# Patient Record
Sex: Male | Born: 1975 | Race: White | Hispanic: No | Marital: Married | Smoking: Never smoker
Health system: Southern US, Community
[De-identification: ages and names within clinical notes are randomized; demographics above are authoritative.]

## PROBLEM LIST (undated history)

## (undated) DIAGNOSIS — F419 Anxiety disorder, unspecified: Secondary | ICD-10-CM

## (undated) DIAGNOSIS — K5190 Ulcerative colitis, unspecified, without complications: Secondary | ICD-10-CM

## (undated) DIAGNOSIS — K529 Noninfective gastroenteritis and colitis, unspecified: Secondary | ICD-10-CM

## (undated) DIAGNOSIS — T7840XA Allergy, unspecified, initial encounter: Secondary | ICD-10-CM

## (undated) HISTORY — DX: Allergy, unspecified, initial encounter: T78.40XA

## (undated) HISTORY — DX: Anxiety disorder, unspecified: F41.9

---

## 2011-10-23 ENCOUNTER — Other Ambulatory Visit: Payer: Self-pay | Admitting: Physician Assistant

## 2013-04-28 ENCOUNTER — Ambulatory Visit (INDEPENDENT_AMBULATORY_CARE_PROVIDER_SITE_OTHER): Payer: BC Managed Care – PPO | Admitting: Family Medicine

## 2013-04-28 ENCOUNTER — Ambulatory Visit: Payer: BC Managed Care – PPO

## 2013-04-28 VITALS — BP 126/78 | HR 82 | Temp 97.6°F | Resp 18 | Ht 71.0 in | Wt 201.0 lb

## 2013-04-28 DIAGNOSIS — M542 Cervicalgia: Secondary | ICD-10-CM

## 2013-04-28 DIAGNOSIS — T148XXA Other injury of unspecified body region, initial encounter: Secondary | ICD-10-CM

## 2013-04-28 MED ORDER — CYCLOBENZAPRINE HCL 5 MG PO TABS: 5.0000 mg | ORAL_TABLET | Freq: Every evening | ORAL | Status: DC | PRN

## 2013-04-28 MED ORDER — MELOXICAM 7.5 MG PO TABS: 7.5000 mg | ORAL_TABLET | Freq: Two times a day (BID) | ORAL | Status: DC | PRN

## 2013-04-28 NOTE — Patient Instructions (Signed)

## 2013-04-28 NOTE — Progress Notes (Signed)
Chief Complaint:  Chief Complaint  Patient presents with  . kink in neck    x2 weeks     HPI: Charles Hardy is a 37 y.o. male who is here for neck pain. Back of neck left side x 2 weeks. Started off sore than got better. Has been getting worse over last few days. He does outdoor work. Neck stiffness and has a sharp nerve type random pain sometimes when he turns his neck side to side. Heating pads, rest, stretching, Icy Hot, and Ibuprofen aleviated some of the aching but the pain returns depending on the position of his neck. 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 No prior injury. Woke up with a stiff neck and that day , he denies any new beds or pillows. No prior history of neck pain.   Past Medical History  Diagnosis Date  . Allergy    History reviewed. No pertinent past surgical history. History   Social History  . Marital Status: Married    Spouse Name: N/A    Number of Children: N/A  . Years of Education: N/A   Social History Main Topics  . Smoking status: Never Smoker   . Smokeless tobacco: None  . Alcohol Use: None  . Drug Use: None  . Sexual Activity: None   Other Topics Concern  . None   Social History Narrative  . None   Family History  Problem Relation Age of Onset  . Diabetes Mother   . Hypertension Mother   . Diabetes Maternal Grandfather   . Hyperlipidemia Maternal Grandfather   . Hypertension Maternal Grandfather    Allergies  Allergen Reactions  . Bee Venom Anaphylaxis   Prior to Admission medications   Medication Sig Start Date End Date Taking? Authorizing Provider  EPIPEN 2-PAK 0.3 MG/0.3ML DEVI USE AS DIRECTED 10/23/11  Yes Chelle S Jeffery, PA-C     ROS: The patient denies fevers, chills,  night sweats, unintentional weight loss, chest pain, palpitations, wheezing, dyspnea on exertion, nausea, vomiting, abdominal pain, dysuria, hematuria, melena, numbness, weakness, or tingling.   All other systems have been reviewed and were otherwise negative with the exception of those mentioned in the HPI and as above.    PHYSICAL EXAM: Filed Vitals:   04/28/13 1016  BP: 126/78  Pulse: 82  Temp: 97.6 F (36.4 C)  Resp: 18   Filed Vitals:   04/28/13 1016  Height: 5' 11"  (1.803 m)  Weight: 201 lb (91.173 kg)   Body mass index is 28.05 kg/(m^2).  General: Alert, no acute distress HEENT:  Normocephalic, atraumatic, oropharynx patent. EOMI, PERRLA Cardiovascular:  Regular rate and rhythm, no rubs murmurs or gallops.  No Carotid bruits, radial pulse intact. No pedal edema.  Respiratory: Clear to auscultation bilaterally.  No wheezes, rales, or rhonchi.  No cyanosis, no use of accessory musculature GI: No organomegaly, abdomen is soft and non-tender, positive bowel sounds.  No masses. Skin: No rashes. Neurologic: Facial musculature symmetric. Psychiatric: Patient is appropriate throughout our interaction. Lymphatic: No cervical lymphadenopathy Musculoskeletal: Gait intact. Decrease ROM to left, flexion 5/5 strength  Sensation intact Shoulder exam nl T spine nl spuriling neg   LABS: No results found for this or  any previous visit.   EKG/XRAY:   Primary read interpreted by Dr. Marin Comment at [facility]. Loss of normal cervical curvature No fx/dislocation   ASSESSMENT/PLAN: Encounter Diagnoses  Name Primary?  . Neck pain on left side Yes  . Sprain and strain    Most likely sprain/strain,mild c spine torticollis Trial of mobic and flexeril No tramadol for now, may call for it later. HE doe snot want to take nay additional meds if not needed F/u prn ROM   Gross sideeffects, risk and benefits, and alternatives of medications d/w patient. Patient is aware that all medications have  potential sideeffects and we are unable to predict every sideeffect or drug-drug interaction that may occur.  LE, [place], DO 04/28/2013 11:59 AM

## 2013-05-07 ENCOUNTER — Encounter: Payer: Self-pay | Admitting: Family Medicine

## 2013-09-20 ENCOUNTER — Telehealth: Payer: Self-pay

## 2013-09-20 NOTE — Telephone Encounter (Signed)
Patient is out of EPIPEN 2-PAK 0.3 MG/0.3ML DEVI And would like a refill; and wants it just in case he has an emergency because he is out. Please advise. He says [facility] doesn't have any requests on file to send [place] a request.   Best: [phone]

## 2013-09-22 MED ORDER — EPINEPHRINE 0.3 MG/0.3ML IJ SOAJ: INTRAMUSCULAR | Status: DC

## 2013-09-22 NOTE — Telephone Encounter (Signed)
Lmom that rx is at pharmacy.

## 2013-09-22 NOTE — Telephone Encounter (Signed)
Rx sent to pharmacy   

## 2014-11-12 ENCOUNTER — Encounter: Payer: Self-pay | Admitting: Family Medicine

## 2014-11-12 ENCOUNTER — Ambulatory Visit (INDEPENDENT_AMBULATORY_CARE_PROVIDER_SITE_OTHER): Payer: BLUE CROSS/BLUE SHIELD | Admitting: Family Medicine

## 2014-11-12 VITALS — BP 148/93 | HR 65 | Temp 98.8°F | Resp 14 | Ht 71.0 in | Wt 195.4 lb

## 2014-11-12 DIAGNOSIS — Z91030 Bee allergy status: Secondary | ICD-10-CM

## 2014-11-12 DIAGNOSIS — F419 Anxiety disorder, unspecified: Secondary | ICD-10-CM | POA: Diagnosis not present

## 2014-11-12 DIAGNOSIS — Z91038 Other insect allergy status: Secondary | ICD-10-CM

## 2014-11-12 DIAGNOSIS — T63441A Toxic effect of venom of bees, accidental (unintentional), initial encounter: Secondary | ICD-10-CM

## 2014-11-12 DIAGNOSIS — L500 Allergic urticaria: Secondary | ICD-10-CM

## 2014-11-12 MED ORDER — ALPRAZOLAM 0.25 MG PO TABS: 0.2500 mg | ORAL_TABLET | Freq: Three times a day (TID) | ORAL | Status: DC | PRN

## 2014-11-12 MED ORDER — PREDNISONE 10 MG PO TABS: ORAL_TABLET | ORAL | Status: DC

## 2014-11-12 MED ORDER — EPINEPHRINE 0.3 MG/0.3ML IJ SOAJ: INTRAMUSCULAR | Status: DC

## 2014-11-12 MED ORDER — RANITIDINE HCL 150 MG PO CAPS: 150.0000 mg | ORAL_CAPSULE | Freq: Two times a day (BID) | ORAL | Status: DC

## 2014-11-12 MED ORDER — CETIRIZINE HCL 10 MG PO TABS
10.0000 mg | ORAL_TABLET | Freq: Once | ORAL | Status: AC
  Administered 2014-11-12: 10 mg via ORAL

## 2014-11-12 MED ORDER — RANITIDINE HCL 150 MG PO TABS
300.0000 mg | ORAL_TABLET | Freq: Once | ORAL | Status: AC
  Administered 2014-11-12: 300 mg via ORAL

## 2014-11-12 MED ORDER — METHYLPREDNISOLONE SODIUM SUCC 125 MG IJ SOLR
125.0000 mg | Freq: Once | INTRAMUSCULAR | Status: AC
  Administered 2014-11-12: 125 mg via INTRAVENOUS

## 2014-11-12 MED ORDER — DIPHENHYDRAMINE HCL 50 MG PO TABS: 50.0000 mg | ORAL_TABLET | Freq: Every day | ORAL | Status: DC

## 2014-11-12 NOTE — Progress Notes (Addendum)
Subjective:  This chart was scribed for Delman Cheadle, MD by Thea Alken, ED Scribe. This patient was seen in room 6 and the patient's care was started at 10:33 AM.   Patient ID: Charles Hardy, male    DOB: 08-23-1975, 39 y.o.   MRN: [id number] Chief Complaint  Patient presents with  . Insect Bite    Stung by yellow jacket "Allergic"    HPI   HPI Comments: Charles Hardy is a 39 y.o. male who presents to the [facility] complaining of one bee sting to right arm that occurred about 30 minutes ago. Pt is allergic to bee venom but denies taking epipen. He has gone into anaphylactic shock once in the past when stung by multiple bees at once but has had more recent occurrences where he did not need go into anaphylactic shock when stung by a single bee. Pt denies taking medication since being sting including benadryl and zyrtec. He denies swelling around lips.     Past Medical History  Diagnosis Date  . Allergy    No past surgical history on file. Prior to Admission medications   Medication Sig Start Date End Date Taking? Authorizing Provider  cyclobenzaprine (FLEXERIL) 5 MG tablet Take 1 tablet (5 mg total) by mouth at bedtime as needed. 04/28/13   Thao P Le, DO  EPINEPHrine (EPIPEN 2-PAK) 0.3 mg/0.3 mL IJ SOAJ injection Use as directed 09/22/13   Collene Leyden, PA-C  meloxicam (MOBIC) 7.5 MG tablet Take 1 tablet (7.5 mg total) by mouth 2 (two) times daily as needed for pain. Take with food, no other NSAIDs 04/28/13   Thao P Le, DO   Review of Systems  Constitutional: Positive for diaphoresis. Negative for fever, chills, activity change and appetite change.  HENT: Negative for drooling, facial swelling, sore throat, trouble swallowing and voice change.   Respiratory: Negative for apnea, cough, choking, chest tightness, shortness of breath, wheezing and stridor.   Cardiovascular: Negative for chest pain and leg swelling.  Skin: Positive for color change and rash.    Neurological: Negative for dizziness, facial asymmetry and light-headedness.  Hematological: Negative for adenopathy.  Psychiatric/Behavioral: The patient is nervous/anxious.    Objective:   Physical Exam  Constitutional: He is oriented to person, place, and time. He appears well-developed and well-nourished. No distress.  HENT:  Head: Normocephalic and atraumatic. Not macrocephalic.  Mouth/Throat: Oropharynx is clear and moist.  Tongue normal. Throat is clear  Eyes: Conjunctivae and EOM are normal.  Neck: Neck supple.  Cardiovascular: Normal rate, regular rhythm and normal heart sounds.  Exam reveals no gallop and no friction rub.   No murmur heard. Pulmonary/Chest: Effort normal and breath sounds normal. No respiratory distress. He has no wheezes. He has no rales. He exhibits no tenderness.  Good air movement.  Musculoskeletal: Normal range of motion.  Lymphadenopathy:    He has no cervical adenopathy.  Neurological: He is alert and oriented to person, place, and time.  Skin: Skin is warm and dry.  Right upper inner arm with 1cm area of induration. Site of sting with urticarial macule with 6 inch diameter surrounding. Urticarial rash over nose, cheeks and bilateral arms.   Psychiatric: He has a normal mood and affect. His behavior is normal.  Nursing note and vitals reviewed.   Filed Vitals:   11/12/14 1119 11/12/14 1132  BP: 140/86 148/93  Pulse: 77 65  Temp: 97.8 F (36.6 C) 98.8 F (37.1 C)  TempSrc:  Oral Oral  Resp: 16 14  Height: 5' 11"  (1.803 m)   Weight: 195 lb 6.4 oz (88.633 kg)   SpO2: 98% 98%   Assessment & Plan:   1. Bee sting reaction, accidental or unintentional, initial encounter   2. Bee sting allergy   3. Allergic urticaria   4. Anxiety disorder, unspecified anxiety disorder type - about taking meds and doctors.  Refilled epi-pen - fortunately he has only showed angioedema type allergy when he was stung at once by multiple bees. Has not had in sev yrs,  epi-pen expired.  IV placed in office today with IV solumedrol 125 x 1 - significant improvement in urticaria several later so sent home w/ below meds for take sched for 10-14d.  Meds ordered this encounter  Medications  . methylPREDNISolone sodium succinate (SOLU-MEDROL) 125 mg/2 mL injection 125 mg    Sig:   . ranitidine (ZANTAC) tablet 300 mg    Sig:   . cetirizine (ZYRTEC) tablet 10 mg    Sig:   . EPINEPHrine (EPIPEN 2-PAK) 0.3 mg/0.3 mL IJ SOAJ injection    Sig: Use as directed    Dispense:  1 Device    Refill:  0  . predniSONE (DELTASONE) 10 MG tablet    Sig: Take 6 tabs today at 3 pm, take 6 tabs tomorrow morning, then 5 tabs the next morning, 4-3-2-1 so last dose next Fri    Dispense:  27 tablet    Refill:  0  . ranitidine (ZANTAC) 150 MG capsule    Sig: Take 1 capsule (150 mg total) by mouth 2 (two) times daily.    Dispense:  60 capsule    Refill:  11  . ALPRAZolam (XANAX) 0.25 MG tablet    Sig: Take 1 tablet (0.25 mg total) by mouth 3 (three) times daily as needed for anxiety.    Dispense:  30 tablet    Refill:  0  . diphenhydrAMINE (BENADRYL) 50 MG tablet    Sig: Take 1 tablet (50 mg total) by mouth at bedtime.    Dispense:  30 tablet    Refill:  11    I personally performed the services described in this documentation, which was scribed in my presence. The recorded information has been reviewed and considered, and addended by me as needed.  Delman Cheadle, MD MPH

## 2014-11-12 NOTE — Patient Instructions (Signed)
Bee, Wasp, or Hornet Sting Your caregiver has diagnosed you as having an insect sting. An insect sting appears as a red lump in the skin that sometimes has a tiny hole in the center, or it may have a stinger in the center of the wound. The most common stings are from wasps, hornets and bees. Individuals have different reactions to insect stings.  A normal reaction may cause pain, swelling, and redness around the sting site.  A localized allergic reaction may cause swelling and redness that extends beyond the sting site.  A large local reaction may continue to develop over the next 12 to 36 hours.  On occasion, the reactions can be severe (anaphylactic reaction). An anaphylactic reaction may cause wheezing; difficulty breathing; chest pain; fainting; raised, itchy, red patches on the skin; a sick feeling to your stomach (nausea); vomiting; cramping; or diarrhea. If you have had an anaphylactic reaction to an insect sting in the past, you are more likely to have one again. HOME CARE INSTRUCTIONS   With bee stings, a small sac of poison is left in the wound. Brushing across this with something such as a credit card, or anything similar, will help remove this and decrease the amount of the reaction. This same procedure will not help a wasp sting as they do not leave behind a stinger and poison sac.  Apply a cold compress for 10 to 20 minutes every hour for 1 to 2 days, depending on severity, to reduce swelling and itching.  To lessen pain, a paste made of water and baking soda may be rubbed on the bite or sting and left on for 5 minutes.  To relieve itching and swelling, you may use take medication or apply medicated creams or lotions as directed.  Only take over-the-counter or prescription medicines for pain, discomfort, or fever as directed by your caregiver.  Wash the sting site daily with soap and water. Apply antibiotic ointment on the sting site as directed.  If you suffered a severe  reaction:  If you did not require hospitalization, an adult will need to stay with you for 24 hours in case the symptoms return.  You may need to wear a medical bracelet or necklace stating the allergy.  You and your family need to learn when and how to use an anaphylaxis kit or epinephrine injection.  If you have had a severe reaction before, always carry your anaphylaxis kit with you. SEEK MEDICAL CARE IF:   None of the above helps within 2 to 3 days.  The area becomes red, warm, tender, and swollen beyond the area of the bite or sting.  You have an oral temperature above 102 F (38.9 C). SEEK IMMEDIATE MEDICAL CARE IF:  You have symptoms of an allergic reaction which are:  Wheezing.  Difficulty breathing.  Chest pain.  Lightheadedness or fainting.  Itchy, raised, red patches on the skin.  Nausea, vomiting, cramping or diarrhea. ANY OF THESE SYMPTOMS MAY REPRESENT A SERIOUS PROBLEM THAT IS AN EMERGENCY. Do not wait to see if the symptoms will go away. Get medical help right away. Call your local emergency services (911 in [place] DO NOT drive yourself to the hospital. MAKE SURE YOU:   Understand these instructions.  Will watch your condition.  Will get help right away if you are not doing well or get worse. Document Released: 04/22/2005 Document Revised: 07/15/2011 Document Reviewed: 10/07/2009 [employer] Patient Information 2015 [employer]. This information is not intended to replace advice given   to you by your health care provider. Make sure you discuss any questions you have with your health care provider. Hives Hives are itchy, red, swollen areas of the skin. They can vary in size and location on your body. Hives can come and go for hours or several days (acute hives) or for several weeks (chronic hives). Hives do not spread from person to person (noncontagious). They may get worse with scratching, exercise, and emotional stress. CAUSES   Allergic reaction to  food, additives, or drugs.  Infections, including the common cold.  Illness, such as vasculitis, lupus, or thyroid disease.  Exposure to sunlight, heat, or cold.  Exercise.  Stress.  Contact with chemicals. SYMPTOMS   Red or white swollen patches on the skin. The patches may change size, shape, and location quickly and repeatedly.  Itching.  Swelling of the hands, feet, and face. This may occur if hives develop deeper in the skin. DIAGNOSIS  Your caregiver can usually tell what is wrong by performing a physical exam. Skin or blood tests may also be done to determine the cause of your hives. In some cases, the cause cannot be determined. TREATMENT  Mild cases usually get better with medicines such as antihistamines. Severe cases may require an emergency epinephrine injection. If the cause of your hives is known, treatment includes avoiding that trigger.  HOME CARE INSTRUCTIONS   Avoid causes that trigger your hives.  Take antihistamines as directed by your caregiver to reduce the severity of your hives. Non-sedating or low-sedating antihistamines are usually recommended. Do not drive while taking an antihistamine.  Take any other medicines prescribed for itching as directed by your caregiver.  Wear loose-fitting clothing.  Keep all follow-up appointments as directed by your caregiver. SEEK MEDICAL CARE IF:   You have persistent or severe itching that is not relieved with medicine.  You have painful or swollen joints. SEEK IMMEDIATE MEDICAL CARE IF:   You have a fever.  Your tongue or lips are swollen.  You have trouble breathing or swallowing.  You feel tightness in the throat or chest.  You have abdominal pain. These problems may be the first sign of a life-threatening allergic reaction. Call your local emergency services (911 in [place] MAKE SURE YOU:   Understand these instructions.  Will watch your condition.  Will get help right away if you are not doing  well or get worse. Document Released: 04/22/2005 Document Revised: 04/27/2013 Document Reviewed: 07/16/2011 [facility] Patient Information 2015 [place], [place]. This information is not intended to replace advice given to you by your health care provider. Make sure you discuss any questions you have with your health care provider. Anaphylactic Reaction An anaphylactic reaction is a sudden, severe allergic reaction that involves the whole body. It can be life threatening. A hospital stay is often required. People with asthma, eczema, or hay fever are slightly more likely to have an anaphylactic reaction. CAUSES  An anaphylactic reaction may be caused by anything to which you are allergic. After being exposed to the allergic substance, your immune system becomes sensitized to it. When you are exposed to that allergic substance again, an allergic reaction can occur. Common causes of an anaphylactic reaction include:  Medicines.  Foods, especially peanuts, wheat, shellfish, milk, and eggs.  Insect bites or stings.  Blood products.  Chemicals, such as dyes, latex, and contrast material used for imaging tests. SYMPTOMS  When an allergic reaction occurs, the body releases histamine and other substances. These substances cause symptoms such  as tightening of the airway. Symptoms often develop within seconds or minutes of exposure. Symptoms may include:  Skin rash or hives.  Itching.  Chest tightness.  Swelling of the eyes, tongue, or lips.  Trouble breathing or swallowing.  Lightheadedness or fainting.  Anxiety or confusion.  Stomach pains, vomiting, or diarrhea.  Nasal congestion.  A fast or irregular heartbeat (palpitations). DIAGNOSIS  Diagnosis is based on your history of recent exposure to allergic substances, your symptoms, and a physical exam. Your caregiver may also perform blood or urine tests to confirm the diagnosis. TREATMENT  Epinephrine medicine is the main treatment for  an anaphylactic reaction. Other medicines that may be used for treatment include antihistamines, steroids, and albuterol. In severe cases, fluids and medicine to support blood pressure may be given through an intravenous line (IV). Even if you improve after treatment, you need to be observed to make sure your condition does not get worse. This may require a stay in the hospital. [place] a medical alert bracelet or necklace stating your allergy.  You and your family must learn how to use an anaphylaxis kit or give an epinephrine injection to temporarily treat an emergency allergic reaction. Always carry your epinephrine injection or anaphylaxis kit with you. This can be lifesaving if you have a severe reaction.  Do not drive or perform tasks after treatment until the medicines used to treat your reaction have worn off, or until your caregiver says it is okay.  If you have hives or a rash:  Take medicines as directed by your caregiver.  You may use an over-the-counter antihistamine (diphenhydramine) as needed.  Apply cold compresses to the skin or take baths in cool water. Avoid hot baths or showers. SEEK MEDICAL CARE IF:   You develop symptoms of an allergic reaction to a new substance. Symptoms may start right away or minutes later.  You develop a rash, hives, or itching.  You develop new symptoms. SEEK IMMEDIATE MEDICAL CARE IF:   You have swelling of the mouth, difficulty breathing, or wheezing.  You have a tight feeling in your chest or throat.  You develop hives, swelling, or itching all over your body.  You develop severe vomiting or diarrhea.  You feel faint or pass out. This is an emergency. Use your epinephrine injection or anaphylaxis kit as you have been instructed. Call your local emergency services (911 in [place] Even if you improve after the injection, you need to be examined at a hospital emergency department. MAKE SURE YOU:   Understand these  instructions.  Will watch your condition.  Will get help right away if you are not doing well or get worse. Document Released: 04/22/2005 Document Revised: 04/27/2013 Document Reviewed: 07/24/2011 [facility] Patient Information 2015 [place], [place]. This information is not intended to replace advice given to you by your health care provider. Make sure you discuss any questions you have with your health care provider.

## 2015-01-18 ENCOUNTER — Ambulatory Visit (INDEPENDENT_AMBULATORY_CARE_PROVIDER_SITE_OTHER): Payer: BLUE CROSS/BLUE SHIELD | Admitting: Emergency Medicine

## 2015-01-18 ENCOUNTER — Telehealth: Payer: Self-pay | Admitting: *Deleted

## 2015-01-18 VITALS — BP 154/82 | HR 70 | Temp 97.7°F | Resp 16 | Ht 70.0 in | Wt 193.0 lb

## 2015-01-18 DIAGNOSIS — K648 Other hemorrhoids: Secondary | ICD-10-CM

## 2015-01-18 DIAGNOSIS — K644 Residual hemorrhoidal skin tags: Secondary | ICD-10-CM

## 2015-01-18 MED ORDER — HYDROCORTISONE ACETATE 25 MG RE SUPP: 25.0000 mg | Freq: Two times a day (BID) | RECTAL | Status: DC

## 2015-01-18 MED ORDER — STARCH 51 % RE SUPP: 1.0000 | RECTAL | Status: DC | PRN

## 2015-01-18 NOTE — Progress Notes (Signed)
Subjective:  Patient ID: Charles Hardy, male    DOB: 1975/11/13  Age: 39 y.o. MRN: [id number]  CC: Mass   HPI Charles Hardy presents  with a small mass on the left anal for urgent. He says rather tender and not associated with constipation or stools. He has no diarrhea or constipation. He has no recent history of straining at stool. Her sitting for long periods at the bathroom  History Charles Hardy has a past medical history of Allergy.   He has no past surgical history on file.   His  family history includes Diabetes in his maternal grandfather and mother; Hyperlipidemia in his maternal grandfather; Hypertension in his maternal grandfather and mother.  He   reports that he has never smoked. He does not have any smokeless tobacco history on file. His alcohol and drug histories are not on file.  Outpatient Prescriptions Prior to Visit  Medication Sig Dispense Refill  . ALPRAZolam (XANAX) 0.25 MG tablet Take 1 tablet (0.25 mg total) by mouth 3 (three) times daily as needed for anxiety. 30 tablet 0  . EPINEPHrine (EPIPEN 2-PAK) 0.3 mg/0.3 mL IJ SOAJ injection Use as directed 1 Device 0  . cyclobenzaprine (FLEXERIL) 5 MG tablet Take 1 tablet (5 mg total) by mouth at bedtime as needed. (Patient not taking: Reported on 01/18/2015) 30 tablet 0  . diphenhydrAMINE (BENADRYL) 50 MG tablet Take 1 tablet (50 mg total) by mouth at bedtime. (Patient not taking: Reported on 01/18/2015) 30 tablet 11  . meloxicam (MOBIC) 7.5 MG tablet Take 1 tablet (7.5 mg total) by mouth 2 (two) times daily as needed for pain. Take with food, no other NSAIDs (Patient not taking: Reported on 01/18/2015) 30 tablet 0  . predniSONE (DELTASONE) 10 MG tablet Take 6 tabs today at 3 pm, take 6 tabs tomorrow morning, then 5 tabs the next morning, 4-3-2-1 so last dose next Fri (Patient not taking: Reported on 01/18/2015) 27 tablet 0  . ranitidine (ZANTAC) 150 MG capsule Take 1 capsule (150 mg total) by mouth 2 (two) times daily.  (Patient not taking: Reported on 01/18/2015) 60 capsule 11   No facility-administered medications prior to visit.    Social History   Social History  . Marital Status: Married    Spouse Name: N/A  . Number of Children: N/A  . Years of Education: N/A   Social History Main Topics  . Smoking status: Never Smoker   . Smokeless tobacco: None  . Alcohol Use: None  . Drug Use: None  . Sexual Activity: Not Asked   Other Topics Concern  . None   Social History Narrative     Review of Systems  Constitutional: Negative for fever, chills and appetite change.  HENT: Negative for congestion, ear pain, postnasal drip, sinus pressure and sore throat.   Eyes: Negative for pain and redness.  Respiratory: Negative for cough, shortness of breath and wheezing.   Cardiovascular: Negative for leg swelling.  Gastrointestinal: Negative for nausea, vomiting, abdominal pain, diarrhea, constipation and blood in stool.  Endocrine: Negative for polyuria.  Genitourinary: Negative for dysuria, urgency, frequency and flank pain.  Musculoskeletal: Negative for gait problem.  Skin: Negative for rash.  Neurological: Negative for weakness and headaches.  Psychiatric/Behavioral: Negative for confusion and decreased concentration. The patient is not nervous/anxious.     Objective:  BP 154/82 mmHg  Pulse 70  Temp(Src) 97.7 F (36.5 C) (Oral)  Resp 16  Ht 5' 10"  (1.778 m)  Wt 193 lb (87.544  kg)  BMI 27.69 kg/m2  SpO2 98%  Physical Exam  Constitutional: He is oriented to person, place, and time. He appears well-developed and well-nourished.  HENT:  Head: Normocephalic and atraumatic.  Eyes: Conjunctivae are normal. Pupils are equal, round, and reactive to light.  Pulmonary/Chest: Effort normal.  Abdominal: Soft. There is tenderness.  Digital rectal examination was significant only for a small thrombosed external hemorrhoid on the left side. at 9:00  Musculoskeletal: He exhibits no edema.    Neurological: He is alert and oriented to person, place, and time.  Skin: Skin is dry.  Psychiatric: He has a normal mood and affect. His behavior is normal. Thought content normal.      Assessment & Plan:   Charles Hardy was seen today for mass.  Diagnoses and all orders for this visit:  External hemorrhoid  Other orders -     starch (ANUSOL) 51 % suppository; Place 1 suppository rectally as needed for pain.   I am having Mr. Callow start on starch. I am also having him maintain his cyclobenzaprine, meloxicam, EPINEPHrine, predniSONE, ranitidine, ALPRAZolam, and diphenhydrAMINE.  Meds ordered this encounter  Medications  . starch (ANUSOL) 51 % suppository    Sig: Place 1 suppository rectally as needed for pain.    Dispense:  24 suppository    Refill:  0   He was given guidance about good stool hygiene. He was given instructions to follow-up with the hemorrhoid is not improved.  Appropriate red flag conditions were discussed with the patient as well as actions that should be taken.  Patient expressed his understanding.  Follow-up: Return if symptoms worsen or fail to improve.  Roselee Culver, MD

## 2015-01-18 NOTE — Telephone Encounter (Signed)
Pt called stating when he got to the pharmacy his medication was wrong.  He states the pharmacist told him he only had a cream available.  However, I advised pt that it was suppose to be an suppository.   Upon searching under pts chart the Rx was printed and not called in.  After speaking to [facility] pharmacy they stated only Anusol-HC cream was called in which was the wrong medication.  I corrected it over the phone and advised the pt I was calling in the correct Rx.  Pt understood.

## 2015-01-18 NOTE — Patient Instructions (Signed)

## 2015-05-25 ENCOUNTER — Ambulatory Visit (INDEPENDENT_AMBULATORY_CARE_PROVIDER_SITE_OTHER): Payer: BLUE CROSS/BLUE SHIELD | Admitting: Family Medicine

## 2015-05-25 VITALS — BP 140/82 | HR 105 | Temp 97.6°F | Resp 18 | Ht 70.0 in | Wt 197.0 lb

## 2015-05-25 DIAGNOSIS — Z23 Encounter for immunization: Secondary | ICD-10-CM | POA: Diagnosis not present

## 2015-05-25 DIAGNOSIS — R1032 Left lower quadrant pain: Secondary | ICD-10-CM | POA: Diagnosis not present

## 2015-05-25 MED ORDER — METRONIDAZOLE 500 MG PO TABS: 500.0000 mg | ORAL_TABLET | Freq: Three times a day (TID) | ORAL | Status: DC

## 2015-05-25 NOTE — Progress Notes (Signed)
By signing my name below, I, Moises Blood, attest that this documentation has been prepared under the direction and in the presence of Robyn Haber, MD. Electronically Signed: Moises Blood, [place]. 05/25/2015 , 8:44 AM .  Patient was seen in room 8 .   Patient ID: Charles Hardy MRN: [id number], DOB: May 30, 1975, 40 y.o. Date of Encounter: 05/25/2015  Primary Physician: No PCP Per Patient  Chief Complaint:  Chief Complaint  Patient presents with  . Abdominal Pain    lower left abd x2 mths off/on  . Flu Vaccine    HPI:  Charles Hardy is a 40 y.o. male who presents to [facility] complaining of intermittent LLQ abd pain that started in October 2016. He states that pain would sometimes radiate towards the middle and also down to his left testicle. He notes that he had similar symptoms in the past 15-20 years ago. He went to GI and they said it was due to stress. He denies taking medications as he only takes vitamins. He denies having colonoscopy. He has some anxiety going to doctors ("white coat syndrome"). He mentions it sometimes acts up after night beer and snacks while watching sports games.   He also requested a flu shot.   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   Past Medical History  Diagnosis Date  . Allergy   . Anxiety      Home Meds: Prior to Admission medications   Medication Sig Start Date End Date Taking? Authorizing Provider  ALPRAZolam (XANAX) 0.25 MG tablet Take 1 tablet (0.25 mg total) by mouth 3 (three) times daily as needed for anxiety. 11/12/14  Yes Shawnee Knapp, MD  EPINEPHrine (EPIPEN 2-PAK) 0.3 mg/0.3 mL IJ SOAJ injection Use as directed 11/12/14  Yes Shawnee Knapp, MD  Multiple Vitamin (MULTIVITAMIN) capsule Take 1 capsule by mouth daily.   Yes Historical Provider, MD  psyllium (METAMUCIL) 58.6 % powder Take 1 packet by mouth 3 (three) times daily.   Yes Historical Provider, MD  cyclobenzaprine (FLEXERIL) 5 MG tablet Take 1 tablet (5 mg total)  by mouth at bedtime as needed. Patient not taking: Reported on 01/18/2015 04/28/13   Thao P Le, DO  diphenhydrAMINE (BENADRYL) 50 MG tablet Take 1 tablet (50 mg total) by mouth at bedtime. Patient not taking: Reported on 01/18/2015 11/12/14   Shawnee Knapp, MD  hydrocortisone (ANUSOL-HC) 25 MG suppository Place 1 suppository (25 mg total) rectally 2 (two) times daily. Patient not taking: Reported on 05/25/2015 01/18/15   Roselee Culver, MD  meloxicam (MOBIC) 7.5 MG tablet Take 1 tablet (7.5 mg total) by mouth 2 (two) times daily as needed for pain. Take with food, no other NSAIDs Patient not taking: Reported on 01/18/2015 04/28/13   Thao P Le, DO  predniSONE (DELTASONE) 10 MG tablet Take 6 tabs today at 3 pm, take 6 tabs tomorrow morning, then 5 tabs the next morning, 4-3-2-1 so last dose next Fri Patient not taking: Reported on 01/18/2015 11/12/14   Shawnee Knapp, MD  ranitidine (ZANTAC) 150 MG capsule Take 1 capsule (150 mg total) by mouth 2 (two) times daily. Patient not taking: Reported on 01/18/2015 11/12/14   Shawnee Knapp, MD    Allergies:  Allergies  Allergen Reactions  . Bee Venom Anaphylaxis    Social History   Social History  . Marital Status: Married    Spouse Name: N/A  . Number of Children: N/A  . Years of Education: N/A  Occupational History  . Not on file.   Social History Main Topics  . Smoking status: Never Smoker   . Smokeless tobacco: Not on file  . Alcohol Use: Not on file  . Drug Use: Not on file  . Sexual Activity: Not on file   Other Topics Concern  . Not on file   Social History Narrative     Review of Systems: Constitutional: negative for fever, chills, night sweats, weight changes, or fatigue  HEENT: negative for vision changes, hearing loss, congestion, rhinorrhea, ST, epistaxis, or sinus pressure Cardiovascular: negative for chest pain or palpitations Respiratory: negative for hemoptysis, wheezing, shortness of breath, or cough Abdominal: negative for  nausea, vomiting, diarrhea, or constipation; positive for abd pain Dermatological: negative for rash Neurologic: negative for headache, dizziness, or syncope All other systems reviewed and are otherwise negative with the exception to those above and in the HPI.  Physical Exam: Blood pressure 140/82, pulse 105, temperature 97.6 F (36.4 C), temperature source Oral, resp. rate 18, height 5' 10"  (1.778 m), weight 197 lb (89.359 kg), SpO2 99 %., Body mass index is 28.27 kg/(m^2). General: Well developed, well nourished, in no acute distress. Head: Normocephalic, atraumatic, eyes without discharge, sclera non-icteric, nares are without discharge. Bilateral auditory canals clear, TM's are without perforation, pearly grey and translucent with reflective cone of light bilaterally. Oral cavity moist, posterior pharynx without exudate, erythema, peritonsillar abscess, or post nasal drip. Facial rosacea Neck: Supple. No thyromegaly. Full ROM. No lymphadenopathy. Lungs: Clear bilaterally to auscultation without wheezes, rales, or rhonchi. Breathing is unlabored. Heart: RRR with S1 S2. No murmurs, rubs, or gallops appreciated. Abdomen: Soft, non-tender, non-distended with normoactive bowel sounds. No hepatomegaly. No rebound/guarding. No obvious abdominal masses. Mildly tender over LLQ with mild bloating Msk:  Strength and tone normal for age. Extremities/Skin: Warm and dry. No clubbing or cyanosis. No edema. No rashes or suspicious lesions. Neuro: Alert and oriented X 3. Moves all extremities spontaneously. Gait is normal. CNII-XII grossly in tact. Psych:  Responds to questions appropriately with a normal affect.    ASSESSMENT AND PLAN:  40 y.o. year old male with  This chart was scribed in my presence and reviewed by me personally.    ICD-9-CM ICD-10-CM   1. Abdominal pain, left lower quadrant 789.04 R10.32 metroNIDAZOLE (FLAGYL) 500 MG tablet     Ambulatory referral to Gastroenterology  2. Need for  prophylactic vaccination and inoculation against influenza V04.81 Z23 Flu Vaccine QUAD 36+ mos IM      Signed, Robyn Haber, MD 05/25/2015 8:44 AM

## 2015-05-25 NOTE — Patient Instructions (Signed)
Your symptoms in the left lower quadrant suggest that you may have either ulcerative colitis or diverticulitis. There is a possibility that you have irritable bowel syndrome, but I think this is less likely.  Because of the ongoing nature of the symptoms, I think it's important to discuss with a gastroenterologist and consider colonoscopy. At this point in putting on Flagyl because it would cover either a colitis or diverticulitis.

## 2015-05-31 ENCOUNTER — Encounter: Payer: Self-pay | Admitting: Gastroenterology

## 2015-07-20 ENCOUNTER — Ambulatory Visit: Payer: Self-pay | Admitting: Gastroenterology

## 2015-08-31 ENCOUNTER — Telehealth: Payer: Self-pay

## 2015-08-31 NOTE — Telephone Encounter (Signed)
Received 20 pages from [facility] - Colonoscopy and EGD reports

## 2016-01-26 ENCOUNTER — Other Ambulatory Visit: Payer: Self-pay | Admitting: Family Medicine

## 2017-12-10 DIAGNOSIS — K580 Irritable bowel syndrome with diarrhea: Secondary | ICD-10-CM | POA: Diagnosis not present

## 2017-12-10 DIAGNOSIS — R030 Elevated blood-pressure reading, without diagnosis of hypertension: Secondary | ICD-10-CM | POA: Diagnosis not present

## 2017-12-10 DIAGNOSIS — R1032 Left lower quadrant pain: Secondary | ICD-10-CM | POA: Diagnosis not present

## 2017-12-10 DIAGNOSIS — K625 Hemorrhage of anus and rectum: Secondary | ICD-10-CM | POA: Diagnosis not present

## 2017-12-23 DIAGNOSIS — K580 Irritable bowel syndrome with diarrhea: Secondary | ICD-10-CM | POA: Diagnosis not present

## 2017-12-23 DIAGNOSIS — K529 Noninfective gastroenteritis and colitis, unspecified: Secondary | ICD-10-CM | POA: Diagnosis not present

## 2017-12-23 DIAGNOSIS — R030 Elevated blood-pressure reading, without diagnosis of hypertension: Secondary | ICD-10-CM | POA: Diagnosis not present

## 2017-12-23 DIAGNOSIS — K625 Hemorrhage of anus and rectum: Secondary | ICD-10-CM | POA: Diagnosis not present

## 2017-12-23 DIAGNOSIS — R1032 Left lower quadrant pain: Secondary | ICD-10-CM | POA: Diagnosis not present

## 2017-12-31 DIAGNOSIS — R197 Diarrhea, unspecified: Secondary | ICD-10-CM | POA: Diagnosis not present

## 2017-12-31 DIAGNOSIS — R1032 Left lower quadrant pain: Secondary | ICD-10-CM | POA: Diagnosis not present

## 2017-12-31 DIAGNOSIS — K529 Noninfective gastroenteritis and colitis, unspecified: Secondary | ICD-10-CM | POA: Diagnosis not present

## 2018-01-05 DIAGNOSIS — R197 Diarrhea, unspecified: Secondary | ICD-10-CM | POA: Diagnosis not present

## 2018-01-15 DIAGNOSIS — Z01818 Encounter for other preprocedural examination: Secondary | ICD-10-CM | POA: Diagnosis not present

## 2018-01-15 DIAGNOSIS — R197 Diarrhea, unspecified: Secondary | ICD-10-CM | POA: Diagnosis not present

## 2018-01-16 DIAGNOSIS — K529 Noninfective gastroenteritis and colitis, unspecified: Secondary | ICD-10-CM | POA: Diagnosis not present

## 2018-01-28 DIAGNOSIS — M9902 Segmental and somatic dysfunction of thoracic region: Secondary | ICD-10-CM | POA: Diagnosis not present

## 2018-01-28 DIAGNOSIS — M9905 Segmental and somatic dysfunction of pelvic region: Secondary | ICD-10-CM | POA: Diagnosis not present

## 2018-01-28 DIAGNOSIS — K5190 Ulcerative colitis, unspecified, without complications: Secondary | ICD-10-CM | POA: Diagnosis not present

## 2018-01-28 DIAGNOSIS — M9903 Segmental and somatic dysfunction of lumbar region: Secondary | ICD-10-CM | POA: Diagnosis not present

## 2018-01-28 DIAGNOSIS — M9901 Segmental and somatic dysfunction of cervical region: Secondary | ICD-10-CM | POA: Diagnosis not present

## 2018-01-29 DIAGNOSIS — K5190 Ulcerative colitis, unspecified, without complications: Secondary | ICD-10-CM | POA: Diagnosis not present

## 2018-01-30 ENCOUNTER — Telehealth: Payer: Self-pay | Admitting: Gastroenterology

## 2018-01-30 DIAGNOSIS — M9903 Segmental and somatic dysfunction of lumbar region: Secondary | ICD-10-CM | POA: Diagnosis not present

## 2018-01-30 DIAGNOSIS — M9905 Segmental and somatic dysfunction of pelvic region: Secondary | ICD-10-CM | POA: Diagnosis not present

## 2018-01-30 DIAGNOSIS — M9902 Segmental and somatic dysfunction of thoracic region: Secondary | ICD-10-CM | POA: Diagnosis not present

## 2018-01-30 DIAGNOSIS — M9901 Segmental and somatic dysfunction of cervical region: Secondary | ICD-10-CM | POA: Diagnosis not present

## 2018-01-30 NOTE — Telephone Encounter (Signed)
ROI faxed to [facility] for records

## 2018-02-10 NOTE — Telephone Encounter (Signed)
Rec'd from [facility] forward 83 pages to [facility]

## 2018-02-19 NOTE — Telephone Encounter (Signed)
Records placed on Dr. Corena Pilgrim desk.  Pt is requesting Dr. Loletha Carrow bc he has heard good things and he would like a 2nd opinion

## 2018-02-23 NOTE — Telephone Encounter (Signed)
I would be glad to see him in second opinion, and will review the records at the visit.  Please arrange a new appointment with me.

## 2018-02-24 ENCOUNTER — Encounter: Payer: Self-pay | Admitting: Gastroenterology

## 2018-03-02 NOTE — Telephone Encounter (Signed)
Pt is scheduled for 03-24-18 with Dr. Loletha Carrow

## 2018-03-24 ENCOUNTER — Ambulatory Visit: Payer: Self-pay | Admitting: Gastroenterology

## 2018-03-25 DIAGNOSIS — K5190 Ulcerative colitis, unspecified, without complications: Secondary | ICD-10-CM | POA: Diagnosis not present

## 2018-03-25 DIAGNOSIS — Z23 Encounter for immunization: Secondary | ICD-10-CM | POA: Diagnosis not present

## 2018-03-25 DIAGNOSIS — I10 Essential (primary) hypertension: Secondary | ICD-10-CM | POA: Diagnosis not present

## 2018-12-29 DIAGNOSIS — K5190 Ulcerative colitis, unspecified, without complications: Secondary | ICD-10-CM | POA: Diagnosis not present

## 2018-12-29 DIAGNOSIS — Z1159 Encounter for screening for other viral diseases: Secondary | ICD-10-CM | POA: Diagnosis not present

## 2019-01-29 DIAGNOSIS — K5190 Ulcerative colitis, unspecified, without complications: Secondary | ICD-10-CM | POA: Diagnosis not present

## 2019-01-29 DIAGNOSIS — Z01818 Encounter for other preprocedural examination: Secondary | ICD-10-CM | POA: Diagnosis not present

## 2019-02-04 DIAGNOSIS — K5190 Ulcerative colitis, unspecified, without complications: Secondary | ICD-10-CM | POA: Diagnosis not present

## 2019-02-10 ENCOUNTER — Encounter (HOSPITAL_COMMUNITY): Payer: Self-pay

## 2019-02-10 ENCOUNTER — Observation Stay (HOSPITAL_COMMUNITY): Payer: BC Managed Care – PPO

## 2019-02-10 ENCOUNTER — Inpatient Hospital Stay (HOSPITAL_COMMUNITY)
Admission: EM | Admit: 2019-02-10 | Discharge: 2019-02-13 | DRG: 385 | Disposition: A | Discharge status: Home / Self Care | Payer: BC Managed Care – PPO | Attending: Internal Medicine | Admitting: Internal Medicine

## 2019-02-10 ENCOUNTER — Other Ambulatory Visit: Payer: Self-pay

## 2019-02-10 DIAGNOSIS — F10939 Alcohol use, unspecified with withdrawal, unspecified: Secondary | ICD-10-CM | POA: Diagnosis present

## 2019-02-10 DIAGNOSIS — K254 Chronic or unspecified gastric ulcer with hemorrhage: Secondary | ICD-10-CM | POA: Diagnosis present

## 2019-02-10 DIAGNOSIS — F1020 Alcohol dependence, uncomplicated: Secondary | ICD-10-CM | POA: Diagnosis not present

## 2019-02-10 DIAGNOSIS — D509 Iron deficiency anemia, unspecified: Secondary | ICD-10-CM

## 2019-02-10 DIAGNOSIS — K5190 Ulcerative colitis, unspecified, without complications: Secondary | ICD-10-CM | POA: Diagnosis present

## 2019-02-10 DIAGNOSIS — E440 Moderate protein-calorie malnutrition: Secondary | ICD-10-CM | POA: Diagnosis not present

## 2019-02-10 DIAGNOSIS — K51018 Ulcerative (chronic) pancolitis with other complication: Secondary | ICD-10-CM | POA: Diagnosis not present

## 2019-02-10 DIAGNOSIS — F10239 Alcohol dependence with withdrawal, unspecified: Secondary | ICD-10-CM | POA: Diagnosis present

## 2019-02-10 DIAGNOSIS — K760 Fatty (change of) liver, not elsewhere classified: Secondary | ICD-10-CM | POA: Diagnosis present

## 2019-02-10 DIAGNOSIS — Z91030 Bee allergy status: Secondary | ICD-10-CM | POA: Diagnosis not present

## 2019-02-10 DIAGNOSIS — E43 Unspecified severe protein-calorie malnutrition: Secondary | ICD-10-CM | POA: Diagnosis not present

## 2019-02-10 DIAGNOSIS — R109 Unspecified abdominal pain: Secondary | ICD-10-CM | POA: Diagnosis not present

## 2019-02-10 DIAGNOSIS — D649 Anemia, unspecified: Secondary | ICD-10-CM | POA: Diagnosis not present

## 2019-02-10 DIAGNOSIS — F10230 Alcohol dependence with withdrawal, uncomplicated: Secondary | ICD-10-CM | POA: Diagnosis not present

## 2019-02-10 DIAGNOSIS — R634 Abnormal weight loss: Secondary | ICD-10-CM

## 2019-02-10 DIAGNOSIS — F10231 Alcohol dependence with withdrawal delirium: Secondary | ICD-10-CM | POA: Diagnosis not present

## 2019-02-10 DIAGNOSIS — K51019 Ulcerative (chronic) pancolitis with unspecified complications: Secondary | ICD-10-CM | POA: Diagnosis not present

## 2019-02-10 DIAGNOSIS — Y908 Blood alcohol level of 240 mg/100 ml or more: Secondary | ICD-10-CM | POA: Diagnosis not present

## 2019-02-10 DIAGNOSIS — Z7952 Long term (current) use of systemic steroids: Secondary | ICD-10-CM

## 2019-02-10 DIAGNOSIS — Z79899 Other long term (current) drug therapy: Secondary | ICD-10-CM

## 2019-02-10 DIAGNOSIS — K51011 Ulcerative (chronic) pancolitis with rectal bleeding: Secondary | ICD-10-CM | POA: Diagnosis not present

## 2019-02-10 DIAGNOSIS — K51911 Ulcerative colitis, unspecified with rectal bleeding: Principal | ICD-10-CM | POA: Diagnosis present

## 2019-02-10 DIAGNOSIS — R531 Weakness: Secondary | ICD-10-CM

## 2019-02-10 DIAGNOSIS — Z20828 Contact with and (suspected) exposure to other viral communicable diseases: Secondary | ICD-10-CM | POA: Diagnosis not present

## 2019-02-10 DIAGNOSIS — K5100 Ulcerative (chronic) pancolitis without complications: Secondary | ICD-10-CM | POA: Diagnosis not present

## 2019-02-10 DIAGNOSIS — F10229 Alcohol dependence with intoxication, unspecified: Secondary | ICD-10-CM | POA: Diagnosis not present

## 2019-02-10 DIAGNOSIS — F419 Anxiety disorder, unspecified: Secondary | ICD-10-CM | POA: Diagnosis present

## 2019-02-10 HISTORY — DX: Ulcerative colitis, unspecified, without complications: K51.90

## 2019-02-10 LAB — CBC
HCT: 29.8 % — ABNORMAL LOW (ref 39.0–52.0)
Hemoglobin: 9.2 g/dL — ABNORMAL LOW (ref 13.0–17.0)
MCH: 27.1 pg (ref 26.0–34.0)
MCHC: 30.9 g/dL (ref 30.0–36.0)
MCV: 87.6 fL (ref 80.0–100.0)
Platelets: 386 10*3/uL (ref 150–400)
RBC: 3.4 MIL/uL — ABNORMAL LOW (ref 4.22–5.81)
RDW: 23 % — ABNORMAL HIGH (ref 11.5–15.5)
WBC: 6.3 10*3/uL (ref 4.0–10.5)
nRBC: 0.6 % — ABNORMAL HIGH (ref 0.0–0.2)

## 2019-02-10 LAB — URINALYSIS, ROUTINE W REFLEX MICROSCOPIC
Bacteria, UA: NONE SEEN
Bilirubin Urine: NEGATIVE
Glucose, UA: NEGATIVE mg/dL
Hgb urine dipstick: NEGATIVE
Ketones, ur: 20 mg/dL — AB
Leukocytes,Ua: NEGATIVE
Nitrite: NEGATIVE
Protein, ur: 30 mg/dL — AB
Specific Gravity, Urine: 1.019 (ref 1.005–1.030)
pH: 5 (ref 5.0–8.0)

## 2019-02-10 LAB — COMPREHENSIVE METABOLIC PANEL
ALT: 75 U/L — ABNORMAL HIGH (ref 0–44)
AST: 149 U/L — ABNORMAL HIGH (ref 15–41)
Albumin: 2.3 g/dL — ABNORMAL LOW (ref 3.5–5.0)
Alkaline Phosphatase: 109 U/L (ref 38–126)
Anion gap: 16 — ABNORMAL HIGH (ref 5–15)
BUN: 6 mg/dL (ref 6–20)
CO2: 21 mmol/L — ABNORMAL LOW (ref 22–32)
Calcium: 7.7 mg/dL — ABNORMAL LOW (ref 8.9–10.3)
Chloride: 103 mmol/L (ref 98–111)
Creatinine, Ser: 0.79 mg/dL (ref 0.61–1.24)
GFR calc Af Amer: >60 mL/min (ref >60)
GFR calc non Af Amer: >60 mL/min (ref >60)
Glucose, Bld: 127 mg/dL — ABNORMAL HIGH (ref 70–99)
Potassium: 4.1 mmol/L (ref 3.5–5.1)
Sodium: 140 mmol/L (ref 135–145)
Total Bilirubin: 0.5 mg/dL (ref 0.3–1.2)
Total Protein: 7 g/dL (ref 6.5–8.1)

## 2019-02-10 LAB — ETHANOL: Alcohol, Ethyl (B): 327 mg/dL (ref <10)

## 2019-02-10 LAB — LIPASE, BLOOD: Lipase: 39 U/L (ref 11–51)

## 2019-02-10 MED ORDER — SODIUM CHLORIDE 0.9 % IV BOLUS
1000.0000 mL | Freq: Once | INTRAVENOUS | Status: AC
  Administered 2019-02-10: 1000 mL via INTRAVENOUS

## 2019-02-10 MED ORDER — THIAMINE HCL 100 MG/ML IJ SOLN
100.0000 mg | Freq: Every day | INTRAMUSCULAR | Status: DC
  Administered 2019-02-10: 100 mg via INTRAVENOUS
  Filled 2019-02-10: qty 2

## 2019-02-10 MED ORDER — LORAZEPAM 2 MG/ML IJ SOLN
0.0000 mg | Freq: Four times a day (QID) | INTRAMUSCULAR | Status: DC
  Administered 2019-02-10 (×2): 1 mg via INTRAVENOUS
  Filled 2019-02-10 (×2): qty 1

## 2019-02-10 MED ORDER — LORAZEPAM 2 MG/ML IJ SOLN: 0.0000 mg | Freq: Two times a day (BID) | INTRAMUSCULAR | Status: DC

## 2019-02-10 MED ORDER — VITAMIN B-1 100 MG PO TABS: 100.0000 mg | ORAL_TABLET | Freq: Every day | ORAL | Status: DC

## 2019-02-10 MED ORDER — SODIUM CHLORIDE 0.9 % IV SOLN
INTRAVENOUS | Status: DC
  Administered 2019-02-11: 02:00:00 via INTRAVENOUS

## 2019-02-10 MED ORDER — LORAZEPAM 1 MG PO TABS: 0.0000 mg | ORAL_TABLET | Freq: Two times a day (BID) | ORAL | Status: DC

## 2019-02-10 MED ORDER — LORAZEPAM 1 MG PO TABS: 0.0000 mg | ORAL_TABLET | Freq: Four times a day (QID) | ORAL | Status: DC

## 2019-02-10 MED ORDER — PANTOPRAZOLE SODIUM 40 MG PO TBEC
80.0000 mg | DELAYED_RELEASE_TABLET | Freq: Every day | ORAL | Status: DC
  Administered 2019-02-10: 80 mg via ORAL
  Filled 2019-02-10: qty 2

## 2019-02-10 MED ORDER — SODIUM CHLORIDE 0.9% FLUSH: 3.0000 mL | Freq: Once | INTRAVENOUS | Status: DC

## 2019-02-10 NOTE — ED Triage Notes (Addendum)
Patient has hx of ulcerative colitis and had ENDO 2 weeks ago and ulcers were found throughout digestive track. Patient here with ongoing malnutrition and reports daily ETOH use to cope with the pain. Patient alert and oriented. Has lost 30 lbs the past couple of months.  Diarrhea multiple episodes daily and increased sleeping per spouse

## 2019-02-10 NOTE — ED Provider Notes (Signed)
[facility] EMERGENCY DEPARTMENT Provider Note   CSN: [id number] Arrival date & time: 02/10/19  1844     History   Chief Complaint No chief complaint on file.   HPI Charles Hardy is a 43 y.o. male with a past medical history of ulcerative colitis.  Patient complains that he has had significant difficulty controlling his UC and difficulty communicating and following up with his GI specialist at [facility] secondary to the coronavirus outbreak.  Patient is on Humira and states that it has not been controlling his symptoms well.  Patient states that he has a history of alcohol dependency and abuse and has been drinking about half a gallon a day since the onset of the pandemic "to cope with my symptoms."  He admits to an alcohol dependence and states that he gets very shaky when he does not drink.  He saw his GI specialist about a month ago and states that it took about a month to get his labs back.  He had a recent colonoscopy about a week or 2 ago that showed severe ulcerative colitis throughout the entire colon.  He complains of loss of appetite, he feels that he is weak and malnourished.  He is using the bathroom "constantly."  Having diarrhea with bloody and mucousy discharge.  He denies any severe abdominal pain.  Last alcohol use was several hours prior to arrival.  He has a 30 pound weight loss over the past few months.  Patient was recently placed on 40 mg daily of prednisone and is continuing to take his Humira.     HPI  Past Medical History:  Diagnosis Date   Allergy    Anxiety    Ulcerative colitis ([place])     There are no active problems to display for this patient.   History reviewed. No pertinent surgical history.      Home Medications    Prior to Admission medications   Medication Sig Start Date End Date Taking? Authorizing Provider  ALPRAZolam (XANAX) 0.25 MG tablet Take 1 tablet (0.25 mg total) by mouth 3 (three) times daily as needed for  anxiety. 11/12/14   Shawnee Knapp, MD  diphenhydrAMINE (BENADRYL) 50 MG tablet Take 1 tablet (50 mg total) by mouth at bedtime. Patient not taking: Reported on 01/18/2015 11/12/14   Shawnee Knapp, MD  EPIPEN 2-PAK 0.3 MG/0.3ML SOAJ injection USE AS DIRECTED 01/27/16   Shawnee Knapp, MD  metroNIDAZOLE (FLAGYL) 500 MG tablet Take 1 tablet (500 mg total) by mouth 3 (three) times daily. 05/25/15   Robyn Haber, MD  Multiple Vitamin (MULTIVITAMIN) capsule Take 1 capsule by mouth daily.    [provider]  ranitidine (ZANTAC) 150 MG capsule Take 1 capsule (150 mg total) by mouth 2 (two) times daily. Patient not taking: Reported on 01/18/2015 11/12/14   Shawnee Knapp, MD    Family History Family History  Problem Relation Age of Onset   Diabetes Mother    Hypertension Mother    Diabetes Maternal Grandfather    Hyperlipidemia Maternal Grandfather    Hypertension Maternal Grandfather     Social History Social History   Tobacco Use   Smoking status: Never Smoker  Substance Use Topics   Alcohol use: Not on file   Drug use: Not on file     Allergies   Bee venom   Review of Systems Review of Systems  Ten systems reviewed and are negative for acute change, except as noted in the  HPI.   Physical Exam Updated Vital Signs BP (!) 145/105    Pulse (!) 118    Temp 98.4 F (36.9 C) (Oral)    Resp 20    SpO2 97%   Physical Exam Vitals signs and nursing note reviewed.  Constitutional:      General: He is not in acute distress.    Appearance: He is well-developed and normal weight. He is not diaphoretic.  HENT:     Head: Normocephalic and atraumatic.  Eyes:     General: No scleral icterus.    Conjunctiva/sclera: Conjunctivae normal.  Neck:     Musculoskeletal: Normal range of motion and neck supple.  Cardiovascular:     Rate and Rhythm: Regular rhythm. Tachycardia present.     Heart sounds: Normal heart sounds.  Pulmonary:     Effort: Pulmonary effort is normal. No respiratory  distress.     Breath sounds: Normal breath sounds.  Abdominal:     Palpations: Abdomen is soft.     Tenderness: There is no abdominal tenderness. There is no guarding or rebound.  Skin:    General: Skin is warm and dry.  Neurological:     Mental Status: He is alert.  Psychiatric:        Behavior: Behavior normal.      ED Treatments / Results  Labs (all labs ordered are listed, but only abnormal results are displayed) Labs Reviewed  COMPREHENSIVE METABOLIC PANEL - Abnormal; Notable for the following components:      Result Value   CO2 21 (*)    Glucose, Bld 127 (*)    Calcium 7.7 (*)    Albumin 2.3 (*)    AST 149 (*)    ALT 75 (*)    Anion gap 16 (*)    All other components within normal limits  CBC - Abnormal; Notable for the following components:   RBC 3.40 (*)    Hemoglobin 9.2 (*)    HCT 29.8 (*)    RDW 23.0 (*)    nRBC 0.6 (*)    All other components within normal limits  URINALYSIS, ROUTINE W REFLEX MICROSCOPIC - Abnormal; Notable for the following components:   APPearance HAZY (*)    Ketones, ur 20 (*)    Protein, ur 30 (*)    All other components within normal limits  LIPASE, BLOOD  ETHANOL    EKG EKG Interpretation  Date/Time:  Wednesday February 10 2019 19:17:21 EDT Ventricular Rate:  122 PR Interval:    QRS Duration: 79 QT Interval:  293 QTC Calculation: 418 R Axis:   65 Text Interpretation:  Sinus tachycardia Abnormal R-wave progression, early transition No previous ECGs available Confirmed by Theotis Burrow [phone]) on 02/10/2019 8:40:47 PM   Radiology No results found.  Procedures .Critical Care Performed by: Margarita Mail, PA-C Authorized by: Margarita Mail, PA-C   Critical care provider statement:    Critical care time (minutes):  35   Critical care time was exclusive of:  Separately billable procedures and treating other patients   Critical care was necessary to treat or prevent imminent or life-threatening deterioration of the  following conditions: Alcohol withdrawal.   Critical care was time spent personally by me on the following activities:  Discussions with consultants, evaluation of patient's response to treatment, examination of patient, ordering and performing treatments and interventions, ordering and review of laboratory studies, ordering and review of radiographic studies, pulse oximetry, re-evaluation of patient's condition, obtaining history from patient or surrogate and review  of old charts   (including critical care time)  Medications Ordered in ED Medications  sodium chloride flush (NS) 0.9 % injection 3 mL (has no administration in time range)  LORazepam (ATIVAN) injection 0-4 mg (1 mg Intravenous Given 02/10/19 2144)    Or  LORazepam (ATIVAN) tablet 0-4 mg ( Oral See Alternative 02/10/19 2144)  LORazepam (ATIVAN) injection 0-4 mg (has no administration in time range)    Or  LORazepam (ATIVAN) tablet 0-4 mg (has no administration in time range)  thiamine (VITAMIN B-1) tablet 100 mg ( Oral See Alternative 02/10/19 2046)    Or  thiamine (B-1) injection 100 mg (100 mg Intravenous Given 02/10/19 2046)  pantoprazole (PROTONIX) EC tablet 80 mg (80 mg Oral Given 02/10/19 2142)  0.9 %  sodium chloride infusion (has no administration in time range)  sodium chloride 0.9 % bolus 1,000 mL (0 mLs Intravenous Stopped 02/10/19 2144)     Initial Impression / Assessment and Plan / ED Course  I have reviewed the triage vital signs and the nursing notes.  Pertinent labs & imaging results that were available during my care of the patient were reviewed by me and considered in my medical decision making (see chart for details).  Clinical Course as of Feb 10 2108  Wed Feb 10, 2019  2044 AST(!): 149 [AH]  2044 ALT(!): 75 [AH]    Clinical Course User Index [AH] Margarita Mail, PA-C      DT:[id number] VS:  Vitals:   02/10/19 2031 02/10/19 2100 02/10/19 2130 02/10/19 2140  BP: (!) 145/105 (!) 127/102 (!) 133/104  (!) 133/104  Pulse: (!) 118 (!) 111 (!) 104 (!) 118  Resp:  (!) 27 (!) 27   Temp:      TempSrc:      SpO2:  98% 96%    KD:XIPJASN is gathered by patient and emr. DDX:.The differential diagnosis of weakness includes but is not limited to neurologic causes (GBS, myasthenia gravis, CVA, MS, ALS, transverse myelitis, spinal cord injury, CVA, botulism, ) and other causes: ACS, Arrhythmia, syncope, orthostatic hypotension, sepsis, hypoglycemia, electrolyte disturbance, hypothyroidism, respiratory failure, symptomatic anemia, dehydration, heat injury, polypharmacy, malignancy. Labs: I reviewed the labs which show urine shows ketones, protein and no evidence of infection.  Alcohol level 327 with symptoms of withdrawal.  Patient's CMP shows elevated blood glucose although he is currently taking prednisone.  Bicarb is low.  Low calcium, low albumin, AST and ALT elevated and a 2-1 parent are consistent with alcohol abuse.  CBC shows a normocytic anemia however patient's lab work done at [facility] shows evidence of iron deficiency anemia.  This may be bleeding from his UC. Imaging acute abdominal film pending EKG: EKG shows sinus tachycardia MDM: Patient here with weakness, fatigue, not eating, 30 pound weight loss over the past several months with ulcerative colitis flare.  He is also severely alcohol dependent and has tachycardia and hypertension consistent with withdrawal. Patient disposition: Admit Patient condition: Serious. The patient appears reasonably stabilized for admission considering the current resources, flow, and capabilities available in the ED at this time, and I doubt any other [facility] requiring further screening and/or treatment in the ED prior to admission.   Final Clinical Impressions(s) / ED Diagnoses   Final diagnoses:  None    ED Discharge Orders    None       Margarita Mail, PA-C 02/10/19 2339    Little, Wenda Overland, MD 02/11/19 [phone]

## 2019-02-11 ENCOUNTER — Observation Stay (HOSPITAL_COMMUNITY): Payer: BC Managed Care – PPO

## 2019-02-11 DIAGNOSIS — K51011 Ulcerative (chronic) pancolitis with rectal bleeding: Secondary | ICD-10-CM

## 2019-02-11 DIAGNOSIS — K760 Fatty (change of) liver, not elsewhere classified: Secondary | ICD-10-CM | POA: Diagnosis present

## 2019-02-11 DIAGNOSIS — D649 Anemia, unspecified: Secondary | ICD-10-CM

## 2019-02-11 DIAGNOSIS — K51911 Ulcerative colitis, unspecified with rectal bleeding: Secondary | ICD-10-CM | POA: Diagnosis present

## 2019-02-11 DIAGNOSIS — E440 Moderate protein-calorie malnutrition: Secondary | ICD-10-CM | POA: Diagnosis not present

## 2019-02-11 DIAGNOSIS — Z79899 Other long term (current) drug therapy: Secondary | ICD-10-CM | POA: Diagnosis not present

## 2019-02-11 DIAGNOSIS — D509 Iron deficiency anemia, unspecified: Secondary | ICD-10-CM | POA: Diagnosis present

## 2019-02-11 DIAGNOSIS — F10239 Alcohol dependence with withdrawal, unspecified: Secondary | ICD-10-CM

## 2019-02-11 DIAGNOSIS — Y908 Blood alcohol level of 240 mg/100 ml or more: Secondary | ICD-10-CM | POA: Diagnosis present

## 2019-02-11 DIAGNOSIS — F10230 Alcohol dependence with withdrawal, uncomplicated: Secondary | ICD-10-CM | POA: Diagnosis not present

## 2019-02-11 DIAGNOSIS — K51018 Ulcerative (chronic) pancolitis with other complication: Secondary | ICD-10-CM | POA: Diagnosis not present

## 2019-02-11 DIAGNOSIS — E43 Unspecified severe protein-calorie malnutrition: Secondary | ICD-10-CM | POA: Diagnosis present

## 2019-02-11 DIAGNOSIS — Z91030 Bee allergy status: Secondary | ICD-10-CM | POA: Diagnosis not present

## 2019-02-11 DIAGNOSIS — F10231 Alcohol dependence with withdrawal delirium: Secondary | ICD-10-CM | POA: Diagnosis not present

## 2019-02-11 DIAGNOSIS — F10229 Alcohol dependence with intoxication, unspecified: Secondary | ICD-10-CM | POA: Diagnosis present

## 2019-02-11 DIAGNOSIS — Z20828 Contact with and (suspected) exposure to other viral communicable diseases: Secondary | ICD-10-CM | POA: Diagnosis present

## 2019-02-11 DIAGNOSIS — Z7952 Long term (current) use of systemic steroids: Secondary | ICD-10-CM | POA: Diagnosis not present

## 2019-02-11 DIAGNOSIS — K5100 Ulcerative (chronic) pancolitis without complications: Secondary | ICD-10-CM | POA: Diagnosis not present

## 2019-02-11 DIAGNOSIS — R109 Unspecified abdominal pain: Secondary | ICD-10-CM | POA: Diagnosis not present

## 2019-02-11 DIAGNOSIS — R531 Weakness: Secondary | ICD-10-CM | POA: Diagnosis present

## 2019-02-11 DIAGNOSIS — K254 Chronic or unspecified gastric ulcer with hemorrhage: Secondary | ICD-10-CM | POA: Diagnosis present

## 2019-02-11 DIAGNOSIS — F419 Anxiety disorder, unspecified: Secondary | ICD-10-CM | POA: Diagnosis present

## 2019-02-11 LAB — CBC
HCT: 23.2 % — ABNORMAL LOW (ref 39.0–52.0)
HCT: 22.6 % — ABNORMAL LOW (ref 39.0–52.0)
HCT: 24.6 % — ABNORMAL LOW (ref 39.0–52.0)
Hemoglobin: 7.1 g/dL — ABNORMAL LOW (ref 13.0–17.0)
Hemoglobin: 7.1 g/dL — ABNORMAL LOW (ref 13.0–17.0)
Hemoglobin: 7.8 g/dL — ABNORMAL LOW (ref 13.0–17.0)
MCH: 27.1 pg (ref 26.0–34.0)
MCH: 27.5 pg (ref 26.0–34.0)
MCH: 28 pg (ref 26.0–34.0)
MCHC: 30.6 g/dL (ref 30.0–36.0)
MCHC: 31.4 g/dL (ref 30.0–36.0)
MCHC: 31.7 g/dL (ref 30.0–36.0)
MCV: 88.5 fL (ref 80.0–100.0)
MCV: 87.6 fL (ref 80.0–100.0)
MCV: 88.2 fL (ref 80.0–100.0)
Platelets: 287 10*3/uL (ref 150–400)
Platelets: 292 10*3/uL (ref 150–400)
Platelets: 273 10*3/uL (ref 150–400)
RBC: 2.62 MIL/uL — ABNORMAL LOW (ref 4.22–5.81)
RBC: 2.58 MIL/uL — ABNORMAL LOW (ref 4.22–5.81)
RBC: 2.79 MIL/uL — ABNORMAL LOW (ref 4.22–5.81)
RDW: 23.3 % — ABNORMAL HIGH (ref 11.5–15.5)
RDW: 23.3 % — ABNORMAL HIGH (ref 11.5–15.5)
RDW: 23.5 % — ABNORMAL HIGH (ref 11.5–15.5)
WBC: 7.5 10*3/uL (ref 4.0–10.5)
WBC: 10.4 10*3/uL (ref 4.0–10.5)
WBC: 8 10*3/uL (ref 4.0–10.5)
nRBC: 0 % (ref 0.0–0.2)
nRBC: 0 % (ref 0.0–0.2)
nRBC: 0 % (ref 0.0–0.2)

## 2019-02-11 LAB — HEPATIC FUNCTION PANEL
ALT: 57 U/L — ABNORMAL HIGH (ref 0–44)
AST: 115 U/L — ABNORMAL HIGH (ref 15–41)
Albumin: 1.9 g/dL — ABNORMAL LOW (ref 3.5–5.0)
Alkaline Phosphatase: 78 U/L (ref 38–126)
Bilirubin, Direct: <0.1 mg/dL (ref 0.0–0.2)
Total Bilirubin: 0.4 mg/dL (ref 0.3–1.2)
Total Protein: 5.7 g/dL — ABNORMAL LOW (ref 6.5–8.1)

## 2019-02-11 LAB — HIV ANTIBODY (ROUTINE TESTING W REFLEX): HIV Screen 4th Generation wRfx: NONREACTIVE

## 2019-02-11 LAB — BASIC METABOLIC PANEL
Anion gap: 13 (ref 5–15)
BUN: 7 mg/dL (ref 6–20)
CO2: 19 mmol/L — ABNORMAL LOW (ref 22–32)
Calcium: 6.9 mg/dL — ABNORMAL LOW (ref 8.9–10.3)
Chloride: 108 mmol/L (ref 98–111)
Creatinine, Ser: 0.77 mg/dL (ref 0.61–1.24)
GFR calc Af Amer: >60 mL/min (ref >60)
GFR calc non Af Amer: >60 mL/min (ref >60)
Glucose, Bld: 73 mg/dL (ref 70–99)
Potassium: 3.9 mmol/L (ref 3.5–5.1)
Sodium: 140 mmol/L (ref 135–145)

## 2019-02-11 LAB — ABO/RH: ABO/RH(D): O NEG

## 2019-02-11 LAB — SARS CORONAVIRUS 2 (TAT 6-24 HRS): SARS Coronavirus 2: NEGATIVE

## 2019-02-11 MED ORDER — ONDANSETRON HCL 4 MG/2ML IJ SOLN
4.0000 mg | Freq: Four times a day (QID) | INTRAMUSCULAR | Status: DC | PRN
  Administered 2019-02-11: 4 mg via INTRAVENOUS
  Filled 2019-02-11: qty 2

## 2019-02-11 MED ORDER — LORAZEPAM 1 MG PO TABS
1.0000 mg | ORAL_TABLET | ORAL | Status: DC | PRN
  Administered 2019-02-11 – 2019-02-12 (×2): 2 mg via ORAL
  Filled 2019-02-11 (×2): qty 2

## 2019-02-11 MED ORDER — FOLIC ACID 1 MG PO TABS
1.0000 mg | ORAL_TABLET | Freq: Every day | ORAL | Status: DC
  Administered 2019-02-11 – 2019-02-13 (×3): 1 mg via ORAL
  Filled 2019-02-11 (×3): qty 1

## 2019-02-11 MED ORDER — METHYLPREDNISOLONE SODIUM SUCC 40 MG IJ SOLR
40.0000 mg | INTRAMUSCULAR | Status: DC
  Administered 2019-02-11: 40 mg via INTRAVENOUS
  Filled 2019-02-11: qty 1

## 2019-02-11 MED ORDER — LORAZEPAM 2 MG/ML IJ SOLN
0.0000 mg | Freq: Four times a day (QID) | INTRAMUSCULAR | Status: AC
  Administered 2019-02-11 (×2): 1 mg via INTRAVENOUS
  Administered 2019-02-12 (×2): 2 mg via INTRAVENOUS
  Filled 2019-02-11 (×4): qty 1

## 2019-02-11 MED ORDER — ADULT MULTIVITAMIN W/MINERALS CH
1.0000 | ORAL_TABLET | Freq: Every day | ORAL | Status: DC
  Administered 2019-02-11 – 2019-02-13 (×3): 1 via ORAL
  Filled 2019-02-11 (×3): qty 1

## 2019-02-11 MED ORDER — ACETAMINOPHEN 650 MG RE SUPP: 650.0000 mg | Freq: Four times a day (QID) | RECTAL | Status: DC | PRN

## 2019-02-11 MED ORDER — THIAMINE HCL 100 MG/ML IJ SOLN
100.0000 mg | Freq: Every day | INTRAMUSCULAR | Status: DC
  Administered 2019-02-11: 100 mg via INTRAVENOUS
  Filled 2019-02-11: qty 2

## 2019-02-11 MED ORDER — ACETAMINOPHEN 325 MG PO TABS: 650.0000 mg | ORAL_TABLET | Freq: Four times a day (QID) | ORAL | Status: DC | PRN

## 2019-02-11 MED ORDER — ONDANSETRON HCL 4 MG PO TABS: 4.0000 mg | ORAL_TABLET | Freq: Four times a day (QID) | ORAL | Status: DC | PRN

## 2019-02-11 MED ORDER — IOHEXOL 300 MG/ML  SOLN
100.0000 mL | Freq: Once | INTRAMUSCULAR | Status: AC | PRN
  Administered 2019-02-11: 100 mL via INTRAVENOUS

## 2019-02-11 MED ORDER — LORAZEPAM 2 MG/ML IJ SOLN: 0.0000 mg | Freq: Two times a day (BID) | INTRAMUSCULAR | Status: DC

## 2019-02-11 MED ORDER — SODIUM CHLORIDE 0.9 % IV SOLN
INTRAVENOUS | Status: DC
  Administered 2019-02-11: 10:00:00 via INTRAVENOUS

## 2019-02-11 MED ORDER — PREDNISONE 20 MG PO TABS
40.0000 mg | ORAL_TABLET | Freq: Every day | ORAL | Status: DC
  Administered 2019-02-11: 40 mg via ORAL
  Filled 2019-02-11: qty 2

## 2019-02-11 MED ORDER — HEPARIN SODIUM (PORCINE) 5000 UNIT/ML IJ SOLN: 5000.0000 [IU] | Freq: Three times a day (TID) | INTRAMUSCULAR | Status: DC

## 2019-02-11 MED ORDER — PANTOPRAZOLE SODIUM 40 MG IV SOLR
40.0000 mg | Freq: Every day | INTRAVENOUS | Status: DC
  Administered 2019-02-11: 40 mg via INTRAVENOUS
  Filled 2019-02-11: qty 40

## 2019-02-11 MED ORDER — LORAZEPAM 2 MG/ML IJ SOLN
1.0000 mg | INTRAMUSCULAR | Status: DC | PRN
  Administered 2019-02-11 – 2019-02-12 (×3): 2 mg via INTRAVENOUS
  Filled 2019-02-11 (×3): qty 1

## 2019-02-11 MED ORDER — VITAMIN B-1 100 MG PO TABS
100.0000 mg | ORAL_TABLET | Freq: Every day | ORAL | Status: DC
  Administered 2019-02-12 – 2019-02-13 (×2): 100 mg via ORAL
  Filled 2019-02-11 (×2): qty 1

## 2019-02-11 NOTE — ED Notes (Signed)
TELE

## 2019-02-11 NOTE — H&P (Addendum)
History and Physical    Charles Hardy CBS:[id number] DOB: 10-Feb-1976 DOA: 02/10/2019  PCP: Patient, No Pcp Per  Patient coming from: Home.  Chief Complaint: Alcohol issues.  HPI: Charles Hardy is a 43 y.o. male with history of ulcerative colitis being followed at [facility] had a recent colonoscopy done 2 weeks ago which showed features concerning for exacerbation of ulcerative colitis and has been having increasing diarrhea with some bloody stools over the last few days and has been placed on prednisone for last 3 days by patient's gastroenterologist was brought to the ER after patient has been drinking alcohol more than usual.  Family was concerned about patient's alcohol issues and malnutrition.  ED Course: In the ER labs show creatinine 1.7 glucose 127 hemoglobin 9.2 platelets 386 COVID-19 was negative.  Alcohol level was 327 acute abdominal series shows features concerning for small bowel obstruction but clinically patient does not have any signs of small bowel obstruction.  EKG shows sinus tachycardia.  Patient admitted for alcohol withdrawal.  Abdomen appears benign on exam.  Review of Systems: As per HPI, rest all negative.   Past Medical History:  Diagnosis Date  . Allergy   . Anxiety   . Ulcerative colitis ([place])     History reviewed. No pertinent surgical history.   reports that he has never smoked. He has never used smokeless tobacco. He reports current alcohol use. No history on file for drug.  Allergies  Allergen Reactions  . Bee Venom Anaphylaxis    Family History  Problem Relation Age of Onset  . Diabetes Mother   . Hypertension Mother   . Diabetes Maternal Grandfather   . Hyperlipidemia Maternal Grandfather   . Hypertension Maternal Grandfather     Prior to Admission medications   Medication Sig Start Date End Date Taking? Authorizing Provider  Adalimumab (HUMIRA) 40 MG/0.4ML PSKT Inject 40 mg into the skin every 14 (fourteen) days.   Yes  [provider]  ALPRAZolam (XANAX) 0.25 MG tablet Take 1 tablet (0.25 mg total) by mouth 3 (three) times daily as needed for anxiety. 11/12/14  Yes Shawnee Knapp, MD  EPIPEN 2-PAK 0.3 MG/0.3ML SOAJ injection USE AS DIRECTED 01/27/16  Yes Shawnee Knapp, MD  Multiple Vitamin (MULTIVITAMIN) capsule Take 1 capsule by mouth daily.   Yes [provider]  omeprazole (PRILOSEC) 40 MG capsule Take 40 mg by mouth daily.   Yes [provider]  predniSONE (DELTASONE) 20 MG tablet Take 40 mg by mouth daily.   Yes [provider]  diphenhydrAMINE (BENADRYL) 50 MG tablet Take 1 tablet (50 mg total) by mouth at bedtime. Patient not taking: Reported on 01/18/2015 11/12/14   Shawnee Knapp, MD  ranitidine (ZANTAC) 150 MG capsule Take 1 capsule (150 mg total) by mouth 2 (two) times daily. Patient not taking: Reported on 01/18/2015 11/12/14   Shawnee Knapp, MD    Physical Exam: Constitutional: Moderately built and nourished. Vitals:   02/10/19 2031 02/10/19 2100 02/10/19 2130 02/10/19 2140  BP: (!) 145/105 (!) 127/102 (!) 133/104 (!) 133/104  Pulse: (!) 118 (!) 111 (!) 104 (!) 118  Resp:  (!) 27 (!) 27   Temp:      TempSrc:      SpO2:  98% 96%    Eyes: Anicteric no pallor. ENMT: No discharge from the ears eyes nose or mouth. Neck: No mass felt.  No neck rigidity. Respiratory: No rhonchi or crepitations. Cardiovascular: S1-S2 heard. Abdomen: Soft  nontender bowel sounds present. Musculoskeletal: No edema. Skin: No rash. Neurologic: Alert awake oriented to time place and person.  Moves all extremities. Psychiatric: Appears normal per normal affect.   Labs on Admission: I have personally reviewed following labs and imaging studies  CBC: Recent Labs  Lab 02/10/19 1856  WBC 6.3  HGB 9.2*  HCT 29.8*  MCV 87.6  PLT 063   Basic Metabolic Panel: Recent Labs  Lab 02/10/19 1856  NA 140  K 4.1  CL 103  CO2 21*  GLUCOSE 127*  BUN 6  CREATININE 0.79  CALCIUM 7.7*   GFR:  CrCl cannot be calculated (Unknown ideal weight.). Liver Function Tests: Recent Labs  Lab 02/10/19 1856  AST 149*  ALT 75*  ALKPHOS 109  BILITOT 0.5  PROT 7.0  ALBUMIN 2.3*   Recent Labs  Lab 02/10/19 1856  LIPASE 39   No results for input(s): AMMONIA in the last 168 hours. Coagulation Profile: No results for input(s): INR, PROTIME in the last 168 hours. Cardiac Enzymes: No results for input(s): CKTOTAL, CKMB, CKMBINDEX, TROPONINI in the last 168 hours. BNP (last 3 results) No results for input(s): PROBNP in the last 8760 hours. HbA1C: No results for input(s): HGBA1C in the last 72 hours. CBG: No results for input(s): GLUCAP in the last 168 hours. Lipid Profile: No results for input(s): CHOL, HDL, LDLCALC, TRIG, CHOLHDL, LDLDIRECT in the last 72 hours. Thyroid Function Tests: No results for input(s): TSH, T4TOTAL, FREET4, T3FREE, THYROIDAB in the last 72 hours. Anemia Panel: No results for input(s): VITAMINB12, FOLATE, FERRITIN, TIBC, IRON, RETICCTPCT in the last 72 hours. Urine analysis:    Component Value Date/Time   COLORURINE YELLOW 02/10/2019 1924   APPEARANCEUR HAZY (A) 02/10/2019 1924   LABSPEC 1.019 02/10/2019 1924   PHURINE 5.0 02/10/2019 1924   GLUCOSEU NEGATIVE 02/10/2019 [place] NEGATIVE 02/10/2019 [place] NEGATIVE 02/10/2019 1924   KETONESUR 20 (A) 02/10/2019 1924   PROTEINUR 30 (A) 02/10/2019 1924   NITRITE NEGATIVE 02/10/2019 1924   LEUKOCYTESUR NEGATIVE 02/10/2019 1924   Sepsis Labs: @LABRCNTIP (procalcitonin:4,lacticidven:4) )No results found for this or any previous visit (from the past 240 hour(s)).   Radiological Exams on Admission: Dg Abd Acute 2+v W 1v Chest  Result Date: 02/10/2019 CLINICAL DATA:  Abdominal pain EXAM: DG ABDOMEN ACUTE W/ 1V CHEST COMPARISON:  None. FINDINGS: There are dilated loops of small bowel predominantly within the left hemiabdomen. There is no free intraperitoneal air. Lungs are clear. Normal  cardiomediastinal contours. IMPRESSION: 1. Dilated loops of small bowel in the left hemiabdomen, compatible with small bowel obstruction. No free intraperitoneal air. 2. Clear lungs. Electronically Signed   By: Ulyses Jarred M.D.   On: 02/10/2019 22:30    EKG: Independently reviewed.  Sinus tachycardia.  Assessment/Plan Principal Problem:   Alcohol withdrawal (HCC) Active Problems:   Ulcerative colitis (HCC)   Normochromic normocytic anemia    1. Alcohol withdrawal -patient is tachycardic likely from alcohol withdrawal.  We will keep patient on CIWA protocol.  Advised about quitting.  Get social work consult. 2. Ulcerative colitis exacerbation recently started on prednisone last 3 days.  Acute abdominal series shows features concerning for possible small bowel obstruction but clinically does not look so.  Will get a CT abdomen pelvis.  Patient on Humira every 2 weeks.  And just 3 days ago was started on prednisone 40 mg.  If patient cannot tolerate orally we will change to IV dosing. 3. Normocytic normochromic anemia -hemoglobin  appears to be at baseline when compared to the labs brought in by patient's family.  Follow CBC. 4. Severe protein calorie malnutrition likely from alcoholism and ulcerative colitis -will get dietitian consult.   DVT prophylaxis: Heparin. Code Status: Full code. Family Communication: Discussed with family. Disposition Plan: Home. Consults called: None. Admission status: Observation.   Rise Patience MD [facility] Pager [phone].  If 7PM-7AM, please contact night-coverage [email or web address] Password [email or web address]  02/11/2019, 12:07 AM

## 2019-02-11 NOTE — Progress Notes (Signed)
Patient arrived to 3W01 A&O x4. Spouse at bedside. Denies pain. POC provided to patient. Nurse will continue to monitor.

## 2019-02-11 NOTE — ED Notes (Signed)
Breakfast Tray Ordered. 

## 2019-02-11 NOTE — Consult Note (Addendum)
[facility] Consult: 10:24 AM 02/11/2019  LOS: 0 days    Referring Provider: Dr Algis Liming.     Primary Care Physician:  Patient, No Pcp Per Primary Gastroenterologist:  Dr. [name] at [facility]     Reason for Consultation: Anemia.  Poorly controlled ulcerative colitis.   HPI: Charles Hardy is a 43 y.o. male.  History anxiety.  Alcohol abuse/dependence.  Elevated LFTs but normal liver imaging.  Mitochondrial antibody, smooth muscle antibody, alpha 1 antitrypsin, ceruloplasmin, hep A IgM, hep B core IgM, hep C total AB, hepatitis B surface antigen were all negative in 06/2015.  07/2015 colonoscopy, Dr. Virgel Bouquet.  For anemia.  Report is in chart but description of findings not dictated into the report.  Letter sent to primary MD notes no polyps and suggest repeat screening in 5 years due to family history of colon polyps 09/2015 EGD.  Esophagitis, gastritis, antral ulcer.  Biopsy showed mild, chronic gastritis with minimal, chronic inflammation.  No H. pylori, dysplasia, metaplasia. 02/2018 colonoscopy.  Do not have report but at this point he was diagnosed with ulcerative colitis. Latest initially treated with oral prednisone and then started Humira every 2 weeks in 03/2018.  Did well for a few months with formed stools about 3 times a day..  Starting around 10/2018 developed more frequent and now bloody diarrhea.  Small stools up to 20 times daily.  Abdominal pain.  25  #weight loss.  No nausea, vomiting, eating fairly well.  Because of COVID there was delay in following up with Dr. [name] but he saw her this past month and underwent 01/16/2019 colonoscopy showed active, diffuse UC.  Stool O&P, C. difficile, cultures all negative.  After stool studies returned normal he was started on prednisone a few days  ago.  Came to the ED because his GI symptoms were not getting any better and he felt like he needed a "tune up" because he was feeling weak, tired. Reported consumption of 1/4 gallon of vodka daily for several months.  He says this is to deal with his symptoms and that drinking alcohol helps reduce his diarrhea  KUB last night shows dilated loops small bowel compatible with SBO. Today's CTAP:   Diffuse bowel wall thickening with hyperenhancement and edema throughout the colon consistent with patient's history UC.  Probable focal narrowing without obstruction in the transverse colon.  Diffuse low-density throughout the liver C/W hepatic steatosis. Hgb 7.1, 9.5 on 12/29/2018.  MCV 87 AST/ALT 115/57.  Normal T bili and alkaline phosphatase.  Albumin low at 1.9.    Family history colon polyps in his grandmother.  His father has ankylosing spondylitis.  No family history of colitis or colon cancer.  Is employed installing invisible fences.  Past Medical History:  Diagnosis Date  . Allergy   . Anxiety   . Ulcerative colitis ([place])     History reviewed. No pertinent surgical history.  Prior to Admission medications   Medication Sig Start Date End Date Taking? Authorizing Provider  Adalimumab (HUMIRA) 40 MG/0.4ML PSKT Inject 40 mg  into the skin every 14 (fourteen) days.   Yes [provider]  ALPRAZolam (XANAX) 0.25 MG tablet Take 1 tablet (0.25 mg total) by mouth 3 (three) times daily as needed for anxiety. 11/12/14  Yes Shawnee Knapp, MD  EPIPEN 2-PAK 0.3 MG/0.3ML SOAJ injection USE AS DIRECTED 01/27/16  Yes Shawnee Knapp, MD  Multiple Vitamin (MULTIVITAMIN) capsule Take 1 capsule by mouth daily.   Yes [provider]  omeprazole (PRILOSEC) 40 MG capsule Take 40 mg by mouth daily.   Yes [provider]  predniSONE (DELTASONE) 20 MG tablet Take 40 mg by mouth daily.   Yes [provider]  diphenhydrAMINE (BENADRYL) 50 MG tablet Take 1 tablet (50 mg total) by mouth at  bedtime. Patient not taking: Reported on 01/18/2015 11/12/14   Shawnee Knapp, MD  ranitidine (ZANTAC) 150 MG capsule Take 1 capsule (150 mg total) by mouth 2 (two) times daily. Patient not taking: Reported on 01/18/2015 11/12/14   Shawnee Knapp, MD    Scheduled Meds: . folic acid  1 mg Oral Daily  . LORazepam  0-4 mg Intravenous Q6H   Followed by  . [START ON 02/13/2019] LORazepam  0-4 mg Intravenous Q12H  . multivitamin with minerals  1 tablet Oral Daily  . pantoprazole (PROTONIX) IV  40 mg Intravenous Daily  . predniSONE  40 mg Oral Q breakfast  . sodium chloride flush  3 mL Intravenous Once  . thiamine  100 mg Oral Daily   Or  . thiamine  100 mg Intravenous Daily   Infusions: . sodium chloride     PRN Meds: acetaminophen **OR** acetaminophen, LORazepam **OR** LORazepam, ondansetron **OR** ondansetron (ZOFRAN) IV   Allergies as of 02/10/2019 - Review Complete 02/10/2019  Allergen Reaction Noted  . Bee venom Anaphylaxis 04/28/2013    Family History  Problem Relation Age of Onset  . Diabetes Mother   . Hypertension Mother   . Diabetes Maternal Grandfather   . Hyperlipidemia Maternal Grandfather   . Hypertension Maternal Grandfather     Social History   Socioeconomic History  . Marital status: Married    Spouse name: Not on file  . Number of children: Not on file  . Years of education: Not on file  . Highest education level: Not on file  Occupational History  . Not on file  Social Needs  . Financial resource strain: Not on file  . Food insecurity    Worry: Not on file    Inability: Not on file  . Transportation needs    Medical: Not on file    Non-medical: Not on file  Tobacco Use  . Smoking status: Never Smoker  . Smokeless tobacco: Never Used  Substance and Sexual Activity  . Alcohol use: Yes    Comment: Vodka everyday.  . Drug use: Not on file  . Sexual activity: Not on file  Lifestyle  . Physical activity    Days per week: Not on file    Minutes per  session: Not on file  . Stress: Not on file  Relationships  . Social Herbalist on phone: Not on file    Gets together: Not on file    Attends religious service: Not on file    Active member of club or organization: Not on file    Attends meetings of clubs or organizations: Not on file    Relationship status: Not on file  . Intimate partner violence    Fear of  current or ex partner: Not on file    Emotionally abused: Not on file    Physically abused: Not on file    Forced sexual activity: Not on file  Other Topics Concern  . Not on file  Social History Narrative  . Not on file    REVIEW OF SYSTEMS: Constitutional: Fatigue. ENT:  No nose bleeds Pulm: No cough.  Some dyspnea on exertion. CV: Has noticed tachycardia, no chest pressure, no LE edema.  GU:  No hematuria, no frequency GI: See HPI.  Tolerating food, no nausea. Heme: No unusual bleeding or bruising. MS: No joint pain Transfusions: Does not recall previous transfusions. Neuro:  No headaches, no peripheral tingling or numbness Derm:  No itching, no rash or sores.  Endocrine:  No sweats or chills.  No polyuria or dysuria Immunization: Not queried    PHYSICAL EXAM: Vital signs in last 24 hours: Vitals:   02/11/19 0843 02/11/19 0945  BP:    Pulse: (!) 134 (!) 125  Resp:  (!) 28  Temp:    SpO2:  97%   Wt Readings from Last 3 Encounters:  05/25/15 89.4 kg  01/18/15 87.5 kg  11/12/14 88.6 kg    General: Pale, not ill-appearing, comfortable WM. Head: No swelling, no asymmetry. Eyes: No scleral icterus, no conjunctival pallor.  EOMI. Ears: Not hard of hearing Nose: No discharge or congestion Mouth: Good dentition.  Tongue midline.  Oral mucosa pink, moist, clear. Neck: No JVD, no masses, no thyromegaly. Lungs: Clear bilaterally.  No labored breathing or cough. Heart: Tacky, regular.  No MRG.  S1, S2 present. Abdomen: Soft, nontender.  No HSM, masses, bruits, hernias.  Active bowel sounds..    Rectal: Deferred Musc/Skeltl: No joint redness, swelling or gross deformity. Extremities: No CCE. Neurologic: Alert.  Oriented x3.  Moves all 4 limbs, no tremor.  Strength not tested. Skin: No rash, no telangiectasia. Nodes: No cervical adenopathy Psych: Calm, cooperative, fluid speech.  Intake/Output from previous day: No intake/output data recorded. Intake/Output this shift: No intake/output data recorded.  LAB RESULTS: Recent Labs    02/10/19 1856 02/11/19 0112 02/11/19 0708  WBC 6.3 7.5 10.4  HGB 9.2* 7.1* 7.1*  HCT 29.8* 23.2* 22.6*  PLT 386 287 292   BMET Lab Results  Component Value Date   NA 140 02/11/2019   NA 140 02/10/2019   K 3.9 02/11/2019   K 4.1 02/10/2019   CL 108 02/11/2019   CL 103 02/10/2019   CO2 19 (L) 02/11/2019   CO2 21 (L) 02/10/2019   GLUCOSE 73 02/11/2019   GLUCOSE 127 (H) 02/10/2019   BUN 7 02/11/2019   BUN 6 02/10/2019   CREATININE 0.77 02/11/2019   CREATININE 0.79 02/10/2019   CALCIUM 6.9 (L) 02/11/2019   CALCIUM 7.7 (L) 02/10/2019   LFT Recent Labs    02/10/19 1856 02/11/19 0112  PROT 7.0 5.7*  ALBUMIN 2.3* 1.9*  AST 149* 115*  ALT 75* 57*  ALKPHOS 109 24  BILITOT 0.5 0.4  BILIDIR  --  <0.1  IBILI  --  NOT CALCULATED   PT/INR No results found for: INR, PROTIME Hepatitis Panel No results for input(s): HEPBSAG, HCVAB, HEPAIGM, HEPBIGM in the last 72 hours. C-Diff No components found for: CDIFF Lipase     Component Value Date/Time   LIPASE 39 02/10/2019 1856    Drugs of Abuse  No results found for: LABOPIA, COCAINSCRNUR, LABBENZ, AMPHETMU, THCU, LABBARB   RADIOLOGY STUDIES: Ct Abdomen Pelvis W Contrast  Result  Date: 02/11/2019 CLINICAL DATA:  Abdominal pain, history of ulcerative colitis EXAM: CT ABDOMEN AND PELVIS WITH CONTRAST TECHNIQUE: Multidetector CT imaging of the abdomen and pelvis was performed using the standard protocol following bolus administration of intravenous contrast. CONTRAST:  136m OMNIPAQUE  IOHEXOL 300 MG/ML  SOLN COMPARISON:  None. FINDINGS: Lower chest: The visualized heart size within normal limits. No pericardial fluid/thickening. No hiatal hernia. The visualized portions of the lungs are clear. Hepatobiliary: There is diffuse low density seen throughout the liver parenchyma.The main portal vein is patent. No evidence of calcified gallstones, gallbladder wall thickening or biliary dilatation. Pancreas: Unremarkable. No pancreatic ductal dilatation or surrounding inflammatory changes. Spleen: Normal in size without focal abnormality. Adrenals/Urinary Tract: Both adrenal glands appear normal. The kidneys and collecting system appear normal without evidence of urinary tract calculus or hydronephrosis. Bladder is unremarkable. Stomach/Bowel: The stomach and small bowel are normal in appearance. There is diffuse bowel wall thickening seen throughout the entirety of the colon with bowel wall edema and hyperenhancement. There is slight mesenteric hyperemia seen around the sigmoid colon. There is a focal area of narrowing seen within the transverse colon best seen on series 6, image 27. Vascular/Lymphatic: There are no enlarged mesenteric, retroperitoneal, or pelvic lymph nodes. No significant vascular findings are present. Reproductive: The prostate is unremarkable. Other: No evidence of abdominal wall mass or hernia. Musculoskeletal: No acute or significant osseous findings. IMPRESSION: 1. Diffuse bowel wall thickening, hyperenhancement, and edema involving the entirety of the colon, consistent with the patient's history of ulcerative colitis. There is a probable focal area of narrowing seen in the transverse colon. 2. No evidence of bowel obstruction. 3. Hepatic steatosis. Electronically Signed   By: BPrudencio PairM.D.   On: 02/11/2019 00:59   Dg Abd Acute 2+v W 1v Chest  Result Date: 02/10/2019 CLINICAL DATA:  Abdominal pain EXAM: DG ABDOMEN ACUTE W/ 1V CHEST COMPARISON:  None. FINDINGS: There are  dilated loops of small bowel predominantly within the left hemiabdomen. There is no free intraperitoneal air. Lungs are clear. Normal cardiomediastinal contours. IMPRESSION: 1. Dilated loops of small bowel in the left hemiabdomen, compatible with small bowel obstruction. No free intraperitoneal air. 2. Clear lungs. Electronically Signed   By: KUlyses JarredM.D.   On: 02/10/2019 22:30     IMPRESSION:   *   Uncontrolled ulcerative colitis despite every 2 week Humira.  Frequent bloody stools and suggestion of narrowing in the transverse colon.  KUB suggests SBO but not confirmed by CT patient is not having nausea vomiting.  *     Normocytic anemia.  Acute on chronic.  Hgb 2 g drop in the last 2+ weeks  *     Alcohol abuse, dependence.  Currently tachycardic so suspect the beginnings of withdrawal.  Transaminases elevated and fatty liver on CT. Full lab work-up for LFT abnormalities in 2017 not confirm any viral hepatitis, autoimmune hepatitis, Wilson's disease.  *  Hx GERD.   Takes daily omeprazole.   PLAN:     *    Begin IV Solu-Medrol 40 mg/day.  Stop prednisone.  Follow CBC, consider transfusion when Hgb at or less than 7.  Since he is tolerating diet and regular diet is ordered, did not adjust diet orders.   SAzucena Freed 02/11/2019, 10:24 AM Phone [phone]    Attending physician's note   I have taken an interval history, reviewed the chart and examined the patient. I agree with the Advanced Practitioner's note, impression and  recommendations.   CT reviewed independently  Mod-severe UC (pancolitis) despite Humira Q2weekly. Last colon Sept 12,2020-active diffuse ulcerative colitis, neg stool studies. Neg Covid-19, No NSAIDs, Alb 1.9 (2.3 yesterday), Hb 7  ETOH dependence with alcoholic liver disease (AST:ALT> 2:1). ETOH level 327.  Associated anxiety  Plan: -IV Solu-Medrol 74m Q8 hrs. -Watch for DTs/alcohol withdrawal.  Hospitalist svc aware. -Check CRP, sed rate, TPMT.  -DVT prophylaxis. -Trend CBC, CMP. (keep Hb>7 and correct electrolytes) -Regular diet if he is able to tolerate. -Will follow along. -May need outpatient psychology/psych consultation. -D/W patient and patient's wife in detail.   RCarmell Austria MD LVelora HecklerGI [phone]

## 2019-02-11 NOTE — ED Notes (Signed)
ED TO INPATIENT HANDOFF REPORT  ED Nurse Name and Phone #:  Nigel Mormon [phone]  S Name/Age/Gender Charles Hardy 43 y.o. male Room/Bed: 016C/016C  Code Status   Code Status: Full Code  Home/SNF/Other Home Patient oriented to: self, place, time and situation Is this baseline? Yes   Triage Complete: Triage complete  Chief Complaint complications with colitis not eating  Triage Note Patient has hx of ulcerative colitis and had ENDO 2 weeks ago and ulcers were found throughout digestive track. Patient here with ongoing malnutrition and reports daily ETOH use to cope with the pain. Patient alert and oriented. Has lost 30 lbs the past couple of months.  Diarrhea multiple episodes daily and increased sleeping per spouse   Allergies Allergies  Allergen Reactions  . Bee Venom Anaphylaxis    Level of Care/Admitting Diagnosis ED Disposition    ED Disposition Condition [facility] Area: [place] [[id number]]  Level of Care: Progressive [102]  Covid Evaluation: Confirmed COVID Negative  Diagnosis: Alcohol withdrawal ([place]) [291.81.ICD-9-CM]  Admitting Physician: Rise Patience [phone]  Attending Physician: Vernell Leep D [[id number]]  Estimated length of stay: past midnight tomorrow  Certification:: I certify this patient will need inpatient services for at least 2 midnights  PT Class (Do Not Modify): Inpatient [101]  PT Acc Code (Do Not Modify): Private [1]       B Medical/Surgery History Past Medical History:  Diagnosis Date  . Allergy   . Anxiety   . Ulcerative colitis ([place])    History reviewed. No pertinent surgical history.   A IV Location/Drains/Wounds Patient Lines/Drains/Airways Status   Active Line/Drains/Airways    Name:   Placement date:   Placement time:   Site:   Days:   Peripheral IV 02/10/19 Right Antecubital   02/10/19    2030    Antecubital   1          Intake/Output Last 24 hours No intake or output  data in the 24 hours ending 02/11/19 1722  Labs/Imaging Results for orders placed or performed during the hospital encounter of 02/10/19 (from the past 48 hour(s))  Lipase, blood     Status: None   Collection Time: 02/10/19  6:56 PM  Result Value Ref Range   Lipase 39 11 - 51 U/L    Comment: Performed at [facility], [place] [address], [place], [place] [address]  Comprehensive metabolic panel     Status: Abnormal   Collection Time: 02/10/19  6:56 PM  Result Value Ref Range   Sodium 140 135 - 145 mmol/L   Potassium 4.1 3.5 - 5.1 mmol/L   Chloride 103 98 - 111 mmol/L   CO2 21 (L) 22 - 32 mmol/L   Glucose, Bld 127 (H) 70 - 99 mg/dL   BUN 6 6 - 20 mg/dL   Creatinine, Ser 0.79 0.61 - 1.24 mg/dL   Calcium 7.7 (L) 8.9 - 10.3 mg/dL   Total Protein 7.0 6.5 - 8.1 g/dL   Albumin 2.3 (L) 3.5 - 5.0 g/dL   AST 149 (H) 15 - 41 U/L   ALT 75 (H) 0 - 44 U/L   Alkaline Phosphatase 109 38 - 126 U/L   Total Bilirubin 0.5 0.3 - 1.2 mg/dL   GFR calc non Af Amer >60 >60 mL/min   GFR calc Af Amer >60 >60 mL/min   Anion gap 16 (H) 5 - 15    Comment: Performed at [facility],  [address], [place], [place] [address]  CBC     Status: Abnormal   Collection Time: 02/10/19  6:56 PM  Result Value Ref Range   WBC 6.3 4.0 - 10.5 K/uL   RBC 3.40 (L) 4.22 - 5.81 MIL/uL   Hemoglobin 9.2 (L) 13.0 - 17.0 g/dL   HCT 29.8 (L) 39.0 - 52.0 %   MCV 87.6 80.0 - 100.0 fL   MCH 27.1 26.0 - 34.0 pg   MCHC 30.9 30.0 - 36.0 g/dL   RDW 23.0 (H) 11.5 - 15.5 %   Platelets 386 150 - 400 K/uL   nRBC 0.6 (H) 0.0 - 0.2 %    Comment: Performed at [facility] [address], [place], [place] [address]  Urinalysis, Routine w reflex microscopic     Status: Abnormal   Collection Time: 02/10/19  7:24 PM  Result Value Ref Range   Color, Urine YELLOW YELLOW   APPearance HAZY (A) CLEAR   Specific Gravity, Urine 1.019 1.005 - 1.030   pH 5.0 5.0 - 8.0   Glucose, UA NEGATIVE NEGATIVE mg/dL   Hgb urine dipstick  NEGATIVE NEGATIVE   Bilirubin Urine NEGATIVE NEGATIVE   Ketones, ur 20 (A) NEGATIVE mg/dL   Protein, ur 30 (A) NEGATIVE mg/dL   Nitrite NEGATIVE NEGATIVE   Leukocytes,Ua NEGATIVE NEGATIVE   RBC / HPF 0-5 0 - 5 RBC/hpf   WBC, UA 0-5 0 - 5 WBC/hpf   Bacteria, UA NONE SEEN NONE SEEN   Mucus PRESENT     Comment: Performed at [facility], [address] [place], [place] [address]  Ethanol     Status: Abnormal   Collection Time: 02/10/19  8:30 PM  Result Value Ref Range   Alcohol, Ethyl (B) 327 (HH) <10 mg/dL    Comment: CRITICAL RESULT CALLED TO, READ BACK BY AND VERIFIED WITH: BAILIFF B,RN 02/10/19 2115 WAYK Performed at [facility], [place] [address], [place], [place] [address]   SARS CORONAVIRUS 2 (TAT 6-24 HRS) Nasopharyngeal Nasopharyngeal Swab     Status: None   Collection Time: 02/10/19  9:45 PM   Specimen: Nasopharyngeal Swab  Result Value Ref Range   SARS Coronavirus 2 NEGATIVE NEGATIVE    Comment: (NOTE) SARS-CoV-2 target nucleic acids are NOT DETECTED. The SARS-CoV-2 RNA is generally detectable in upper and lower respiratory specimens during the acute phase of infection. Negative results do not preclude SARS-CoV-2 infection, do not rule out co-infections with other pathogens, and should not be used as the sole basis for treatment or other patient management decisions. Negative results must be combined with clinical observations, patient history, and epidemiological information. The expected result is Negative. Fact Sheet for Patients: SugarRoll.be Fact Sheet for Healthcare Providers: https://www.woods-mathews.com/ This test is not yet approved or cleared by the [place] FDA and  has been authorized for detection and/or diagnosis of SARS-CoV-2 by FDA under an Emergency Use Authorization (EUA). This EUA will remain  in effect (meaning this test can be used) for the duration of the COVID-19 declaration under Section  56 4(b)(1) of the Act, 21 U.S.C. section 360bbb-3(b)(1), unless the authorization is terminated or revoked sooner. Performed at [facility], [place] [address], [place], [place] [address]   HIV Antibody (routine testing w rflx)     Status: None   Collection Time: 02/11/19  1:12 AM  Result Value Ref Range   HIV Screen 4th Generation wRfx NON REACTIVE NON REACTIVE    Comment:  Performed at [facility], [place] [address], [place], [place] [address]  Basic metabolic panel     Status: Abnormal   Collection Time: 02/11/19  1:12 AM  Result Value Ref Range   Sodium 140 135 - 145 mmol/L   Potassium 3.9 3.5 - 5.1 mmol/L   Chloride 108 98 - 111 mmol/L   CO2 19 (L) 22 - 32 mmol/L   Glucose, Bld 73 70 - 99 mg/dL   BUN 7 6 - 20 mg/dL   Creatinine, Ser 0.77 0.61 - 1.24 mg/dL   Calcium 6.9 (L) 8.9 - 10.3 mg/dL   GFR calc non Af Amer >60 >60 mL/min   GFR calc Af Amer >60 >60 mL/min   Anion gap 13 5 - 15    Comment: Performed at [facility] [address], [place], [place] [address]  CBC     Status: Abnormal   Collection Time: 02/11/19  1:12 AM  Result Value Ref Range   WBC 7.5 4.0 - 10.5 K/uL   RBC 2.62 (L) 4.22 - 5.81 MIL/uL   Hemoglobin 7.1 (L) 13.0 - 17.0 g/dL    Comment: REPEATED TO VERIFY   HCT 23.2 (L) 39.0 - 52.0 %   MCV 88.5 80.0 - 100.0 fL   MCH 27.1 26.0 - 34.0 pg   MCHC 30.6 30.0 - 36.0 g/dL   RDW 23.3 (H) 11.5 - 15.5 %   Platelets 287 150 - 400 K/uL   nRBC 0.0 0.0 - 0.2 %    Comment: Performed at [facility],  [address], [place], [place] [address]  Hepatic function panel     Status: Abnormal   Collection Time: 02/11/19  1:12 AM  Result Value Ref Range   Total Protein 5.7 (L) 6.5 - 8.1 g/dL   Albumin 1.9 (L) 3.5 - 5.0 g/dL   AST 115 (H) 15 - 41 U/L   ALT 57 (H) 0 - 44 U/L   Alkaline Phosphatase 78 38 - 126 U/L   Total Bilirubin 0.4 0.3 - 1.2 mg/dL   Bilirubin, Direct <0.1 0.0 - 0.2 mg/dL   Indirect Bilirubin NOT CALCULATED 0.3 - 0.9 mg/dL     Comment: Performed at [facility] [address], [place], [place] [address]  CBC     Status: Abnormal   Collection Time: 02/11/19  7:08 AM  Result Value Ref Range   WBC 10.4 4.0 - 10.5 K/uL   RBC 2.58 (L) 4.22 - 5.81 MIL/uL   Hemoglobin 7.1 (L) 13.0 - 17.0 g/dL   HCT 22.6 (L) 39.0 - 52.0 %   MCV 87.6 80.0 - 100.0 fL   MCH 27.5 26.0 - 34.0 pg   MCHC 31.4 30.0 - 36.0 g/dL   RDW 23.3 (H) 11.5 - 15.5 %   Platelets 292 150 - 400 K/uL   nRBC 0.0 0.0 - 0.2 %    Comment: Performed at [facility], [place] [address] [place], [place] [address]  Type and screen [place]     Status: None   Collection Time: 02/11/19  7:24 AM  Result Value Ref Range   ABO/RH(D) O NEG    Antibody Screen NEG    Sample Expiration      02/14/2019,2359 Performed at [facility], [place] [address] [place],  [address]   ABO/Rh     Status: None   Collection Time: 02/11/19  7:24 AM  Result Value Ref Range  ABO/RH(D)      Jenetta Downer NEG Performed at [facility] [address], [place], [place] [address]    Ct Abdomen Pelvis W Contrast  Result Date: 02/11/2019 CLINICAL DATA:  Abdominal pain, history of ulcerative colitis EXAM: CT ABDOMEN AND PELVIS WITH CONTRAST TECHNIQUE: Multidetector CT imaging of the abdomen and pelvis was performed using the standard protocol following bolus administration of intravenous contrast. CONTRAST:  171m OMNIPAQUE IOHEXOL 300 MG/ML  SOLN COMPARISON:  None. FINDINGS: Lower chest: The visualized heart size within normal limits. No pericardial fluid/thickening. No hiatal hernia. The visualized portions of the lungs are clear. Hepatobiliary: There is diffuse low density seen throughout the liver parenchyma.The main portal vein is patent. No evidence of calcified gallstones, gallbladder wall thickening or biliary dilatation. Pancreas: Unremarkable. No pancreatic ductal dilatation or surrounding inflammatory changes. Spleen: Normal in size without focal  abnormality. Adrenals/Urinary Tract: Both adrenal glands appear normal. The kidneys and collecting system appear normal without evidence of urinary tract calculus or hydronephrosis. Bladder is unremarkable. Stomach/Bowel: The stomach and small bowel are normal in appearance. There is diffuse bowel wall thickening seen throughout the entirety of the colon with bowel wall edema and hyperenhancement. There is slight mesenteric hyperemia seen around the sigmoid colon. There is a focal area of narrowing seen within the transverse colon best seen on series 6, image 27. Vascular/Lymphatic: There are no enlarged mesenteric, retroperitoneal, or pelvic lymph nodes. No significant vascular findings are present. Reproductive: The prostate is unremarkable. Other: No evidence of abdominal wall mass or hernia. Musculoskeletal: No acute or significant osseous findings. IMPRESSION: 1. Diffuse bowel wall thickening, hyperenhancement, and edema involving the entirety of the colon, consistent with the patient's history of ulcerative colitis. There is a probable focal area of narrowing seen in the transverse colon. 2. No evidence of bowel obstruction. 3. Hepatic steatosis. Electronically Signed   By: BPrudencio PairM.D.   On: 02/11/2019 00:59   Dg Abd Acute 2+v W 1v Chest  Result Date: 02/10/2019 CLINICAL DATA:  Abdominal pain EXAM: DG ABDOMEN ACUTE W/ 1V CHEST COMPARISON:  None. FINDINGS: There are dilated loops of small bowel predominantly within the left hemiabdomen. There is no free intraperitoneal air. Lungs are clear. Normal cardiomediastinal contours. IMPRESSION: 1. Dilated loops of small bowel in the left hemiabdomen, compatible with small bowel obstruction. No free intraperitoneal air. 2. Clear lungs. Electronically Signed   By: KUlyses JarredM.D.   On: 02/10/2019 22:30    Pending Labs Unresulted Labs (From admission, onward)    Start     Ordered   02/12/19 0500  Comprehensive metabolic panel  Tomorrow morning,   R      02/11/19 0956   02/11/19 1700  CBC  Now then every 12 hours,   R (with STAT occurrences)    Comments: Page MD with results.    02/11/19 0956   02/11/19 0500  HIV4GL Save Tube  (HIV Antibody (Routine testing w reflex) panel)  Tomorrow morning,   R     02/11/19 0005          Vitals/Pain Today's Vitals   02/11/19 1526 02/11/19 1557 02/11/19 1600 02/11/19 1701  BP:   (!) 142/106   Pulse: (!) 118  (!) 122   Resp: (!) 27  13   Temp:      TempSrc:      SpO2: 99%  99%   PainSc:  0-No pain  0-No pain    Isolation Precautions No active isolations  Medications  Medications  sodium chloride flush (NS) 0.9 % injection 3 mL (3 mLs Intravenous Not Given 02/11/19 0236)  acetaminophen (TYLENOL) tablet 650 mg (has no administration in time range)    Or  acetaminophen (TYLENOL) suppository 650 mg (has no administration in time range)  ondansetron (ZOFRAN) tablet 4 mg ( Oral See Alternative 02/11/19 0225)    Or  ondansetron (ZOFRAN) injection 4 mg (4 mg Intravenous Given 02/11/19 0225)  pantoprazole (PROTONIX) injection 40 mg (40 mg Intravenous Given 02/11/19 1148)  LORazepam (ATIVAN) tablet 1-4 mg ( Oral See Alternative 02/11/19 0845)    Or  LORazepam (ATIVAN) injection 1-4 mg (2 mg Intravenous Given 02/11/19 0845)  thiamine (VITAMIN B-1) tablet 100 mg ( Oral See Alternative 02/11/19 1145)    Or  thiamine (B-1) injection 100 mg (100 mg Intravenous Given 10/0/71 2197)  folic acid (FOLVITE) tablet 1 mg (1 mg Oral Given 02/11/19 1232)  multivitamin with minerals tablet 1 tablet (1 tablet Oral Given 02/11/19 1148)  LORazepam (ATIVAN) injection 0-4 mg (1 mg Intravenous Given 02/11/19 1232)    Followed by  LORazepam (ATIVAN) injection 0-4 mg (has no administration in time range)  0.9 %  sodium chloride infusion ( Intravenous New Bag/Given 02/11/19 1028)  methylPREDNISolone sodium succinate (SOLU-MEDROL) 40 mg/mL injection 40 mg (40 mg Intravenous Given 02/11/19 1231)  sodium chloride 0.9 % bolus 1,000 mL  (0 mLs Intravenous Stopped 02/10/19 2144)  iohexol (OMNIPAQUE) 300 MG/ML solution 100 mL (100 mLs Intravenous Contrast Given 02/11/19 0016)    Mobility walks Low fall risk   Focused Assessments Cardiac Assessment Handoff:    No results found for: CKTOTAL, CKMB, CKMBINDEX, TROPONINI No results found for: DDIMER Does the Patient currently have chest pain? No      R Recommendations: See Admitting Provider Note  Report given to:   Additional Notes:  Patient independent, A&O x 4, denies pain. In between receiving Ativan, patient can become increasingly anxious, but managing symptoms well. Currently resting, denies need for prn Ativan, wife at bedside.

## 2019-02-11 NOTE — Progress Notes (Signed)
PROGRESS NOTE   Charles Hardy  XFG:[id number]    DOB: [birth year]    DOA: 02/10/2019  PCP: Patient, No Pcp Per   I have briefly reviewed patients previous medical records in [facility].  Chief complaint Worsening diarrhea with intermittent blood in stools Alcohol-related issues.  Brief Narrative:  43 year old married male, works for [employer], PMH of ulcerative colitis diagnosed approximately a year ago on Humira, follows with Dr. [name], GI at [facility] in [place], anxiety disorder, alcohol dependence, for worsening UC symptoms underwent colonoscopy 9/25 which showed findings of active diffuse UC, started oral prednisone 3 days PTA with little improvement, ongoing heavy alcohol use for "self-medication" presented to [facility] ED on 10/7 due to ongoing profuse diarrhea, intermittent blood in stools, concern regarding alcohol issues and malnutrition.  Admitted for alcohol intoxication with early withdrawal and ulcerative colitis exacerbation.  [facility] consulted.   Assessment & Plan:   Principal Problem:   Alcohol withdrawal ([place]) Active Problems:   Ulcerative colitis (H[facility])   Normochromic normocytic anemia   Ulcerative colitis exacerbation  Reportedly diagnosed approximately a year ago and has been on Humira.  Was doing better until mid summer when started having progressively worsening diarrhea with intermittent blood in stools.  S/p colonoscopy 9/25 by Dr. [name], [facility] in [facility] which showed diffuse active ulcerative colitis.  Spouse has hard copy of colonoscopy report.  As per patient/spouse's report, stool was tested and negative for "infection".  Started on p.o. prednisone 40 mg daily 3 days PTA.  Little improvement with ongoing very frequent small-volume diarrhea with some decrease in blood in stools.  Patient and spouse frustrated regarding lack of control of his UC and exploring options for evaluation at [facility], limited and  restricted due to ongoing [place] pandemic.  CT abdomen, pelvis with contrast 10/8: Diffuse bowel wall thickening, hyperenhancement and edema involving the entire colon consistent with patient's history of ulcerative colitis.  There is a probable focal area of narrowing seen in transverse colon but no evidence of bowel obstruction.  [facility] consulted for assistance.  Alcohol dependence/intoxication/early withdrawal.  Reportedly has been drinking up to half a gallon of liquor daily for several months for "self-medicating" for GI symptoms.  Last alcohol intake was on day of admission.  BAL on admission: 327.  Having early withdrawal symptoms and is at risk for severe withdrawal including DTs.  Continue CIWA protocol and monitor closely.  Anemia, likely chronic disease and iron deficiency  Baseline hemoglobin in the 9 g range.  On initial arrival, hemoglobin 9.2 which has dropped to 7.1, repeated and verified.  Likely multifactorial due to chronic disease, iron deficiency and slow GI blood loss.  Follow CBCs closely including later this evening and consider transfusion if hemoglobin 7 g or less.  Discussed this in detail with patient and spouse at bedside and they are contemplating.  Malnutrition, undefined  Reported poor oral intake and approximately 25 pound weight loss over the last couple of months.  Dietitian consulted for input.  Sinus tachycardia and high blood pressure  No known history of hypertension  Likely related to alcohol withdrawal, treat likewise and monitor closely.  May consider PRN IV hydralazine.  Anxiety disorder  Currently on Ativan per CIWA protocol.  Abnormal LFTs  Mild transaminitis, unclear etiology.  Follow LFTs closely   DVT prophylaxis: SCDs.  Discontinued heparin products due to intermittent blood in stools from active UC. Code Status: Full Family Communication: Discussed in detail with  patient spouse at bedside, updated care and  answered questions. Disposition: Due to ongoing alcohol withdrawal issues which could get worse, require close monitoring and management, active ulcerative colitis awaiting GI input, significant anemia that could also worsen require transfusion, patient will require close hospital level monitoring and management and will stay at least an additional night if not longer.  Thereby changed from observation to inpatient status.   Consultants:  [name]  Procedures:  None.  Antimicrobials:  None.   Subjective: Patient interviewed and examined this morning in ED with spouse at bedside.  Patient's ulcerative colitis doing poorly since mid summer and they feel that they delayed seeking medical attention due to ongoing COVID pandemic.  Has been having diarrhea up to 20 or more times per day, small volume, loose with intermittent blood in stools and abdominal cramping.  Poor appetite and significant weight loss as noted above.  No fever or chills.  Easy fatigability but denies dizziness, lightheadedness, chest pain, dyspnea or passing out.  Objective:  Vitals:   02/10/19 2130 02/10/19 2140 02/11/19 0500 02/11/19 0656  BP: (!) 133/104 (!) 133/104 125/88 (!) 141/102  Pulse: (!) 104 (!) 118 (!) 121 (!) 115  Resp: (!) 27  (!) 27 20  Temp:    98.2 F (36.8 C)  TempSrc:    Oral  SpO2: 96%  95% 95%    Examination:  General exam: Pleasant young male, moderately built and thinly nourished sitting up comfortably in gurney.  Subsequently seen ambulating independently and steadily to the toilet. Respiratory system: Clear to auscultation. Respiratory effort normal. Cardiovascular system: S1 & S2 heard, RRR. No JVD, murmurs, rubs, gallops or clicks. No pedal edema.  Telemetry personally reviewed: Sinus tachycardia in the 110s-120s. Gastrointestinal system: Abdomen is nondistended, soft and nontender. No organomegaly or masses felt. Normal bowel sounds heard. Central nervous system: Alert and oriented.  No focal neurological deficits. Extremities: Symmetric 5 x 5 power. Skin: No rashes, lesions or ulcers Psychiatry: Judgement and insight appear normal. Mood & affect, slightly anxious but not tremulous.     Data Reviewed: I have personally reviewed following labs and imaging studies  CBC: Recent Labs  Lab 02/10/19 1856 02/11/19 0112 02/11/19 0708  WBC 6.3 7.5 10.4  HGB 9.2* 7.1* 7.1*  HCT 29.8* 23.2* 22.6*  MCV 87.6 88.5 87.6  PLT 386 287 751   Basic Metabolic Panel: Recent Labs  Lab 02/10/19 1856 02/11/19 0112  NA 140 140  K 4.1 3.9  CL 103 108  CO2 21* 19*  GLUCOSE 127* 73  BUN 6 7  CREATININE 0.79 0.77  CALCIUM 7.7* 6.9*   Liver Function Tests: Recent Labs  Lab 02/10/19 1856 02/11/19 0112  AST 149* 115*  ALT 75* 57*  ALKPHOS 109 78  BILITOT 0.5 0.4  PROT 7.0 5.7*  ALBUMIN 2.3* 1.9*    Cardiac Enzymes: No results for input(s): CKTOTAL, CKMB, CKMBINDEX, TROPONINI in the last 168 hours.  CBG: No results for input(s): GLUCAP in the last 168 hours.  Recent Results (from the past 240 hour(s))  SARS CORONAVIRUS 2 (TAT 6-24 HRS) Nasopharyngeal Nasopharyngeal Swab     Status: None   Collection Time: 02/10/19  9:45 PM   Specimen: Nasopharyngeal Swab  Result Value Ref Range Status   SARS Coronavirus 2 NEGATIVE NEGATIVE Final    Comment: (NOTE) SARS-CoV-2 target nucleic acids are NOT DETECTED. The SARS-CoV-2 RNA is generally detectable in upper and lower respiratory specimens during the acute phase of infection. Negative results do  not preclude SARS-CoV-2 infection, do not rule out co-infections with other pathogens, and should not be used as the sole basis for treatment or other patient management decisions. Negative results must be combined with clinical observations, patient history, and epidemiological information. The expected result is Negative. Fact Sheet for Patients: SugarRoll.be Fact Sheet for Healthcare Providers:  https://www.woods-mathews.com/ This test is not yet approved or cleared by the [place] FDA and  has been authorized for detection and/or diagnosis of SARS-CoV-2 by FDA under an Emergency Use Authorization (EUA). This EUA will remain  in effect (meaning this test can be used) for the duration of the COVID-19 declaration under Section 56 4(b)(1) of the Act, 21 U.S.C. section 360bbb-3(b)(1), unless the authorization is terminated or revoked sooner. Performed at [facility], [place] [address] [place], [place] [address]          Radiology Studies: Ct Abdomen Pelvis W Contrast  Result Date: 02/11/2019 CLINICAL DATA:  Abdominal pain, history of ulcerative colitis EXAM: CT ABDOMEN AND PELVIS WITH CONTRAST TECHNIQUE: Multidetector CT imaging of the abdomen and pelvis was performed using the standard protocol following bolus administration of intravenous contrast. CONTRAST:  133m OMNIPAQUE IOHEXOL 300 MG/ML  SOLN COMPARISON:  None. FINDINGS: Lower chest: The visualized heart size within normal limits. No pericardial fluid/thickening. No hiatal hernia. The visualized portions of the lungs are clear. Hepatobiliary: There is diffuse low density seen throughout the liver parenchyma.The main portal vein is patent. No evidence of calcified gallstones, gallbladder wall thickening or biliary dilatation. Pancreas: Unremarkable. No pancreatic ductal dilatation or surrounding inflammatory changes. Spleen: Normal in size without focal abnormality. Adrenals/Urinary Tract: Both adrenal glands appear normal. The kidneys and collecting system appear normal without evidence of urinary tract calculus or hydronephrosis. Bladder is unremarkable. Stomach/Bowel: The stomach and small bowel are normal in appearance. There is diffuse bowel wall thickening seen throughout the entirety of the colon with bowel wall edema and hyperenhancement. There is slight mesenteric hyperemia seen around the sigmoid  colon. There is a focal area of narrowing seen within the transverse colon best seen on series 6, image 27. Vascular/Lymphatic: There are no enlarged mesenteric, retroperitoneal, or pelvic lymph nodes. No significant vascular findings are present. Reproductive: The prostate is unremarkable. Other: No evidence of abdominal wall mass or hernia. Musculoskeletal: No acute or significant osseous findings. IMPRESSION: 1. Diffuse bowel wall thickening, hyperenhancement, and edema involving the entirety of the colon, consistent with the patient's history of ulcerative colitis. There is a probable focal area of narrowing seen in the transverse colon. 2. No evidence of bowel obstruction. 3. Hepatic steatosis. Electronically Signed   By: BPrudencio PairM.D.   On: 02/11/2019 00:59   Dg Abd Acute 2+v W 1v Chest  Result Date: 02/10/2019 CLINICAL DATA:  Abdominal pain EXAM: DG ABDOMEN ACUTE W/ 1V CHEST COMPARISON:  None. FINDINGS: There are dilated loops of small bowel predominantly within the left hemiabdomen. There is no free intraperitoneal air. Lungs are clear. Normal cardiomediastinal contours. IMPRESSION: 1. Dilated loops of small bowel in the left hemiabdomen, compatible with small bowel obstruction. No free intraperitoneal air. 2. Clear lungs. Electronically Signed   By: KUlyses JarredM.D.   On: 02/10/2019 22:30        Scheduled Meds: . folic acid  1 mg Oral Daily  . LORazepam  0-4 mg Intravenous Q6H   Followed by  . [START ON 02/13/2019] LORazepam  0-4 mg Intravenous Q12H  . multivitamin with minerals  1 tablet Oral Daily  .  pantoprazole (PROTONIX) IV  40 mg Intravenous Daily  . predniSONE  40 mg Oral Q breakfast  . sodium chloride flush  3 mL Intravenous Once  . thiamine  100 mg Oral Daily   Or  . thiamine  100 mg Intravenous Daily   Continuous Infusions: . sodium chloride 100 mL/hr at 02/11/19 0224     LOS: 0 days     Vernell Leep, MD, FACP, [facility]. [facility]  To contact the  attending provider between 7A-7P or the covering provider during after hours 7P-7A, please log into the web site [email or web address] and access using universal [place] password for that web site. If you do not have the password, please call the hospital operator.  02/11/2019, 9:34 AM

## 2019-02-12 DIAGNOSIS — E440 Moderate protein-calorie malnutrition: Secondary | ICD-10-CM

## 2019-02-12 DIAGNOSIS — D509 Iron deficiency anemia, unspecified: Secondary | ICD-10-CM

## 2019-02-12 LAB — COMPREHENSIVE METABOLIC PANEL WITH GFR
ALT: 50 U/L — ABNORMAL HIGH (ref 0–44)
AST: 74 U/L — ABNORMAL HIGH (ref 15–41)
Albumin: 1.8 g/dL — ABNORMAL LOW (ref 3.5–5.0)
Alkaline Phosphatase: 74 U/L (ref 38–126)
Anion gap: 9 (ref 5–15)
BUN: <5 mg/dL — ABNORMAL LOW (ref 6–20)
CO2: 26 mmol/L (ref 22–32)
Calcium: 8 mg/dL — ABNORMAL LOW (ref 8.9–10.3)
Chloride: 101 mmol/L (ref 98–111)
Creatinine, Ser: 0.71 mg/dL (ref 0.61–1.24)
GFR calc Af Amer: >60 mL/min
GFR calc non Af Amer: >60 mL/min
Glucose, Bld: 116 mg/dL — ABNORMAL HIGH (ref 70–99)
Potassium: 4.2 mmol/L (ref 3.5–5.1)
Sodium: 136 mmol/L (ref 135–145)
Total Bilirubin: 0.7 mg/dL (ref 0.3–1.2)
Total Protein: 5.7 g/dL — ABNORMAL LOW (ref 6.5–8.1)

## 2019-02-12 LAB — CBC
HCT: 22.7 % — ABNORMAL LOW (ref 39.0–52.0)
HCT: 26.6 % — ABNORMAL LOW (ref 39.0–52.0)
Hemoglobin: 6.7 g/dL — CL (ref 13.0–17.0)
Hemoglobin: 8.6 g/dL — ABNORMAL LOW (ref 13.0–17.0)
MCH: 25.9 pg — ABNORMAL LOW (ref 26.0–34.0)
MCH: 28 pg (ref 26.0–34.0)
MCHC: 29.5 g/dL — ABNORMAL LOW (ref 30.0–36.0)
MCHC: 32.3 g/dL (ref 30.0–36.0)
MCV: 87.6 fL (ref 80.0–100.0)
MCV: 86.6 fL (ref 80.0–100.0)
Platelets: 256 10*3/uL (ref 150–400)
Platelets: 251 10*3/uL (ref 150–400)
RBC: 2.59 MIL/uL — ABNORMAL LOW (ref 4.22–5.81)
RBC: 3.07 MIL/uL — ABNORMAL LOW (ref 4.22–5.81)
RDW: 23.3 % — ABNORMAL HIGH (ref 11.5–15.5)
RDW: 22.3 % — ABNORMAL HIGH (ref 11.5–15.5)
WBC: 6.9 10*3/uL (ref 4.0–10.5)
WBC: 9 10*3/uL (ref 4.0–10.5)
nRBC: 0 % (ref 0.0–0.2)
nRBC: 0 % (ref 0.0–0.2)

## 2019-02-12 LAB — PREPARE RBC (CROSSMATCH)

## 2019-02-12 LAB — C-REACTIVE PROTEIN: CRP: <0.8 mg/dL (ref <1.0)

## 2019-02-12 LAB — SEDIMENTATION RATE: Sed Rate: 1 mm/hr (ref 0–16)

## 2019-02-12 MED ORDER — TRAZODONE HCL 50 MG PO TABS
50.0000 mg | ORAL_TABLET | Freq: Every evening | ORAL | Status: DC | PRN
  Administered 2019-02-12: 50 mg via ORAL
  Filled 2019-02-12: qty 1

## 2019-02-12 MED ORDER — HYDRALAZINE HCL 10 MG PO TABS: 10.0000 mg | ORAL_TABLET | Freq: Three times a day (TID) | ORAL | Status: DC | PRN

## 2019-02-12 MED ORDER — SODIUM CHLORIDE 0.9% IV SOLUTION
Freq: Once | INTRAVENOUS | Status: AC
  Administered 2019-02-12: 06:00:00 via INTRAVENOUS

## 2019-02-12 MED ORDER — PANTOPRAZOLE SODIUM 40 MG PO TBEC
40.0000 mg | DELAYED_RELEASE_TABLET | Freq: Every day | ORAL | Status: DC
  Administered 2019-02-12 – 2019-02-13 (×2): 40 mg via ORAL
  Filled 2019-02-12 (×2): qty 1

## 2019-02-12 MED ORDER — METHYLPREDNISOLONE SODIUM SUCC 40 MG IJ SOLR: 20.0000 mg | Freq: Three times a day (TID) | INTRAMUSCULAR | Status: DC

## 2019-02-12 MED ORDER — METHYLPREDNISOLONE SODIUM SUCC 40 MG IJ SOLR
20.0000 mg | Freq: Three times a day (TID) | INTRAMUSCULAR | Status: DC
  Administered 2019-02-12 – 2019-02-13 (×2): 20 mg via INTRAVENOUS
  Filled 2019-02-12 (×2): qty 1

## 2019-02-12 NOTE — Progress Notes (Addendum)
Daily Rounding Note  02/12/2019, 11:15 AM  LOS: 1 day   SUBJECTIVE:   Chief complaint: Frequent, bloody stools.  Abdominal pain.  Uncontrolled ulcerative colitis     Patient overall feels better.  He still having frequent and small stools.  He has had about 7 so far today but has not seen any blood.  Tolerating solid food.  No nausea or vomiting  OBJECTIVE:         Vital signs in last 24 hours:    Temp:  [97.5 F (36.4 C)-98.6 F (37 C)] 98 F (36.7 C) (10/09 0910) Pulse Rate:  [84-129] 102 (10/09 0910) Resp:  [13-27] 20 (10/09 0910) BP: (121-155)/(98-112) 132/100 (10/09 0910) SpO2:  [93 %-100 %] 100 % (10/09 0910) Last BM Date: 02/11/19 There were no vitals filed for this visit. General: Pale, does not look acutely ill but looks moderately unwell Heart: Tacky, regular. Chest: Clear bilaterally.  No cough or labored breathing. Abdomen: Soft.  Not tender.  Active bowel sounds. Extremities: CCE Neuro/Psych: Oriented x3.  No tremors.  No gross deficits.  Intake/Output from previous day: 10/08 0701 - 10/09 0700 In: 10 [I.V.:10] Out: -   Intake/Output this shift: Total I/O In: 400 [Blood:400] Out: -   Lab Results: Recent Labs    02/11/19 0708 02/11/19 1700 02/12/19 0331  WBC 10.4 8.0 6.9  HGB 7.1* 7.8* 6.7*  HCT 22.6* 24.6* 22.7*  PLT 292 273 256   BMET Recent Labs    02/10/19 1856 02/11/19 0112 02/12/19 0331  NA 140 140 136  K 4.1 3.9 4.2  CL 103 108 101  CO2 21* 19* 26  GLUCOSE 127* 73 116*  BUN 6 7 <5*  CREATININE 0.79 0.77 0.71  CALCIUM 7.7* 6.9* 8.0*   LFT Recent Labs    02/10/19 1856 02/11/19 0112 02/12/19 0331  PROT 7.0 5.7* 5.7*  ALBUMIN 2.3* 1.9* 1.8*  AST 149* 115* 74*  ALT 75* 57* 50*  ALKPHOS 109 78 74  BILITOT 0.5 0.4 0.7  BILIDIR  --  <0.1  --   IBILI  --  NOT CALCULATED  --    PT/INR No results for input(s): LABPROT, INR in the last 72 hours. Hepatitis Panel No  results for input(s): HEPBSAG, HCVAB, HEPAIGM, HEPBIGM in the last 72 hours.  Studies/Results: Ct Abdomen Pelvis W Contrast  Result Date: 02/11/2019 CLINICAL DATA:  Abdominal pain, history of ulcerative colitis EXAM: CT ABDOMEN AND PELVIS WITH CONTRAST TECHNIQUE: Multidetector CT imaging of the abdomen and pelvis was performed using the standard protocol following bolus administration of intravenous contrast. CONTRAST:  153m OMNIPAQUE IOHEXOL 300 MG/ML  SOLN COMPARISON:  None. FINDINGS: Lower chest: The visualized heart size within normal limits. No pericardial fluid/thickening. No hiatal hernia. The visualized portions of the lungs are clear. Hepatobiliary: There is diffuse low density seen throughout the liver parenchyma.The main portal vein is patent. No evidence of calcified gallstones, gallbladder wall thickening or biliary dilatation. Pancreas: Unremarkable. No pancreatic ductal dilatation or surrounding inflammatory changes. Spleen: Normal in size without focal abnormality. Adrenals/Urinary Tract: Both adrenal glands appear normal. The kidneys and collecting system appear normal without evidence of urinary tract calculus or hydronephrosis. Bladder is unremarkable. Stomach/Bowel: The stomach and small bowel are normal in appearance. There is diffuse bowel wall thickening seen throughout the entirety of the colon with bowel wall edema and hyperenhancement. There is slight mesenteric hyperemia seen around the sigmoid colon. There is a focal area of  narrowing seen within the transverse colon best seen on series 6, image 27. Vascular/Lymphatic: There are no enlarged mesenteric, retroperitoneal, or pelvic lymph nodes. No significant vascular findings are present. Reproductive: The prostate is unremarkable. Other: No evidence of abdominal wall mass or hernia. Musculoskeletal: No acute or significant osseous findings. IMPRESSION: 1. Diffuse bowel wall thickening, hyperenhancement, and edema involving the  entirety of the colon, consistent with the patient's history of ulcerative colitis. There is a probable focal area of narrowing seen in the transverse colon. 2. No evidence of bowel obstruction. 3. Hepatic steatosis. Electronically Signed   By: Prudencio Pair M.D.   On: 02/11/2019 00:59   Dg Abd Acute 2+v W 1v Chest  Result Date: 02/10/2019 CLINICAL DATA:  Abdominal pain EXAM: DG ABDOMEN ACUTE W/ 1V CHEST COMPARISON:  None. FINDINGS: There are dilated loops of small bowel predominantly within the left hemiabdomen. There is no free intraperitoneal air. Lungs are clear. Normal cardiomediastinal contours. IMPRESSION: 1. Dilated loops of small bowel in the left hemiabdomen, compatible with small bowel obstruction. No free intraperitoneal air. 2. Clear lungs. Electronically Signed   By: Ulyses Jarred M.D.   On: 02/10/2019 22:30   Scheduled Meds:  folic acid  1 mg Oral Daily   LORazepam  0-4 mg Intravenous Q6H   Followed by   Derrill Memo ON 02/13/2019] LORazepam  0-4 mg Intravenous Q12H   methylPREDNISolone (SOLU-MEDROL) injection  20 mg Intravenous Q8H   multivitamin with minerals  1 tablet Oral Daily   pantoprazole  40 mg Oral Q1200   sodium chloride flush  3 mL Intravenous Once   thiamine  100 mg Oral Daily   Continuous Infusions: PRN Meds:.acetaminophen **OR** acetaminophen, hydrALAZINE, LORazepam **OR** LORazepam, ondansetron **OR** ondansetron (ZOFRAN) IV ASSESMENT:    *    Poorly controlled UC despite chronic Humira. 01/16/2019 colonoscopy with active, diffuse UC.  Stool studies negative for C. difficile and infectious elements.  Started prednisone 40 mg daily 3 days PTA. Possible narrowing in transverse colon per CT. and films raised question of SBO but patient never had nausea vomiting. Now on Solu-Medrol day 2.  *     Alcoholism  *    Hepatic steatosis per CT.  *    Anemia.  Hgb 9.2 >> 7.1 > 6.7.  PRBC x 2 began this morning at 550.   PLAN   *   Sed rate, CRP, TPMT ordered.     *   CBC in the morning.  *    Continue the IV Solu-Medrol for now.    Azucena Freed  02/12/2019, 11:15 AM Phone [phone]   Attending physician's note   I have taken an interval history, reviewed the chart and examined the patient. I agree with the Advanced Practitioner's note, impression and recommendations.   Moderate to severe pan ulcerative colitis. EtOH dependence and alcoholic liver disease. Anxiety  Improving on IV Solu-Medrol Agree with blood transfusion Monitor and correct electrolytes Regular diet Discussed above with the patient and patient's wife.  Carmell Austria, MD [name] [phone].

## 2019-02-12 NOTE — Progress Notes (Signed)
PROGRESS NOTE   Charles Hardy  KGU:[id number]    DOB: 1976/03/24    DOA: 02/10/2019  PCP: Charles Hardy   I have briefly reviewed patients previous medical records in [facility].  Chief complaint Worsening diarrhea with intermittent blood in stools Alcohol-related issues.  Brief Narrative:  43 year old married male, works for [employer], PMH of ulcerative colitis diagnosed approximately a year ago on Humira, follows with Dr. Alan Hardy, GI at [facility] in [place], anxiety disorder, alcohol dependence, for worsening UC symptoms underwent colonoscopy 9/25 which showed findings of active diffuse UC, started oral prednisone 3 days PTA with little improvement, ongoing heavy alcohol use for "self-medication" presented to [facility] ED on 10/7 due to ongoing profuse diarrhea, intermittent blood in stools, concern regarding alcohol issues and malnutrition.  Admitted for alcohol intoxication with early withdrawal and ulcerative colitis exacerbation.  [facility] consulted.  Slowly improving.   Assessment & Plan:   Principal Problem:   Alcohol withdrawal ([place]) Active Problems:   Ulcerative colitis (HCC)   Normochromic normocytic anemia   Ulcerative colitis exacerbation  Reportedly diagnosed approximately a year ago and has been on Humira every 2 weeks.  Was doing better until mid summer when started having progressively worsening diarrhea with intermittent blood in stools.  S/p colonoscopy 9/25 by Dr. Alan Hardy, [facility] in [facility] which showed diffuse active ulcerative colitis.    As Hardy patient/spouse's report, stool was tested and negative for "infection".  Started on p.o. prednisone 40 mg daily 3 days PTA.  On admission had little improvement with ongoing very frequent small-volume diarrhea with some decrease in blood in stools.  Patient and spouse frustrated regarding lack of control of his UC and exploring options for evaluation at [facility],  limited and restricted due to ongoing [place] pandemic.  CT abdomen, pelvis with contrast 10/8: Diffuse bowel wall thickening, hyperenhancement and edema involving the entire colon consistent with patient's history of ulcerative colitis.  There is a probable focal area of narrowing seen in transverse colon but no evidence of bowel obstruction.  [facility] consultation appreciated.  Prednisone was switched to IV Solu-Medrol 40 mg every 24 hours.  Slowly improving  Alcohol dependence/intoxication/early withdrawal.  Reportedly has been drinking up to half a gallon of liquor daily for several months for "self-medicating" for GI symptoms.  Last alcohol intake was on day of admission.  BAL on admission: 327.  Having early withdrawal symptoms and is at risk for severe withdrawal including DTs.  Ongoing withdrawal signs and symptoms, CIWA score 4-6 yesterday but up to 11 this morning.  Continue CIWA protocol  Anemia, likely chronic disease and iron deficiency  Baseline hemoglobin in the 9 g range.  On initial arrival, hemoglobin 9.2 which has dropped to 7.1, repeated and verified.  Likely multifactorial due to chronic disease, iron deficiency and slow GI blood loss.  Follow CBCs closely including later this evening and consider transfusion if hemoglobin 7 g or less.  Hemoglobin dropped to 6.7 on 10/9.  Completing first unit PRBC, follow posttransfusion CBC and hold off transfusing second unit as long as hemoglobin >7.  Discussed with GI regarding initiation of oral iron supplements.  Malnutrition, undefined  Reported poor oral intake and approximately 25 pound weight loss over the last couple of months.  Dietitian consulted for input and have not seen him yet.  Sinus tachycardia and high blood pressure  No known history of hypertension  Likely related to alcohol withdrawal, treat likewise and  monitor closely.  May consider PRN IV hydralazine.  Continue current management.   Anxiety disorder  Currently on Ativan Hardy CIWA protocol.  Abnormal LFTs  Mild transaminitis, likely related to alcohol dependence.  Slightly better.   DVT prophylaxis: SCDs.  Discontinued heparin products due to intermittent blood in stools from active UC. Code Status: Full Family Communication: Discussed in detail with patient spouse at bedside on 10/8, updated care and answered questions.  None at bedside today. Disposition: DC home pending clinical improvement, possibly in the next 24 to 48 hours.   Consultants:  [facility] GI  Procedures:  None.  Antimicrobials:  None.   Subjective: Overall feels better.  Stool frequency has decreased, reports approximately 10 BMs in the last 24 hours compared to 20 prior to admission.  Small-volume, loose and no blood noted.  Has a lot of urgency.  No abdominal pain.  Tolerating diet.  Some anxiety.  Tremulousness better.  Objective:  Vitals:   02/12/19 0550 02/12/19 0608 02/12/19 0619 02/12/19 0816  BP: (!) 129/99 (!) 127/102 (!) 124/99 (!) 138/105  Pulse: (!) 111 (!) 102 95 (!) 109  Resp: 18 18 18 20   Temp: 98.2 F (36.8 C) (!) 97.5 F (36.4 C)  98.1 F (36.7 C)  TempSrc: Oral Oral  Oral  SpO2: 100% 100% 100% 100%    Examination:  General exam: Pleasant young male, moderately built and thinly nourished lying comfortably propped up in bed without distress.  Looks much improved compared to yesterday.  Oral mucosa moist. Respiratory system: Clear to auscultation.  No increased work of breathing. Cardiovascular system: S1 and S2 heard, RRR.  No JVD, murmurs or pedal edema.  Telemetry personally reviewed: Mostly sinus rhythm.  Occasional mild sinus tachycardia in the 110s. Gastrointestinal system: Abdomen is nondistended, soft and nontender.  No organomegaly or masses appreciated.  Normal bowel sounds heard. Central nervous system: Alert and oriented. No focal neurological deficits. Extremities: Symmetric 5 x 5 power. Skin: No  rashes, lesions or ulcers Psychiatry: Judgement and insight appear normal. Mood & affect, slightly anxious but not tremulous.     Data Reviewed: I have personally reviewed following labs and imaging studies  CBC: Recent Labs  Lab 02/10/19 1856 02/11/19 0112 02/11/19 0708 02/11/19 1700 02/12/19 0331  WBC 6.3 7.5 10.4 8.0 6.9  HGB 9.2* 7.1* 7.1* 7.8* 6.7*  HCT 29.8* 23.2* 22.6* 24.6* 22.7*  MCV 87.6 88.5 87.6 88.2 87.6  PLT 386 287 292 273 449   Basic Metabolic Panel: Recent Labs  Lab 02/10/19 1856 02/11/19 0112 02/12/19 0331  NA 140 140 136  K 4.1 3.9 4.2  CL 103 108 101  CO2 21* 19* 26  GLUCOSE 127* 73 116*  BUN 6 7 <5*  CREATININE 0.79 0.77 0.71  CALCIUM 7.7* 6.9* 8.0*   Liver Function Tests: Recent Labs  Lab 02/10/19 1856 02/11/19 0112 02/12/19 0331  AST 149* 115* 74*  ALT 75* 57* 50*  ALKPHOS 109 78 74  BILITOT 0.5 0.4 0.7  PROT 7.0 5.7* 5.7*  ALBUMIN 2.3* 1.9* 1.8*    Cardiac Enzymes: No results for input(s): CKTOTAL, CKMB, CKMBINDEX, TROPONINI in the last 168 hours.  CBG: No results for input(s): GLUCAP in the last 168 hours.  Recent Results (from the past 240 hour(s))  SARS CORONAVIRUS 2 (TAT 6-24 HRS) Nasopharyngeal Nasopharyngeal Swab     Status: None   Collection Time: 02/10/19  9:45 PM   Specimen: Nasopharyngeal Swab  Result Value Ref Range Status   SARS Coronavirus  2 NEGATIVE NEGATIVE Final    Comment: (NOTE) SARS-CoV-2 target nucleic acids are NOT DETECTED. The SARS-CoV-2 RNA is generally detectable in upper and lower respiratory specimens during the acute phase of infection. Negative results do not preclude SARS-CoV-2 infection, do not rule out co-infections with other pathogens, and should not be used as the sole basis for treatment or other patient management decisions. Negative results must be combined with clinical observations, patient history, and epidemiological information. The expected result is Negative. Fact Sheet for  Patients: SugarRoll.be Fact Sheet for Healthcare Providers: https://www.woods-mathews.com/ This test is not yet approved or cleared by the [place] FDA and  has been authorized for detection and/or diagnosis of SARS-CoV-2 by FDA under an Emergency Use Authorization (EUA). This EUA will remain  in effect (meaning this test can be used) for the duration of the COVID-19 declaration under Section 56 4(b)(1) of the Act, 21 U.S.C. section 360bbb-3(b)(1), unless the authorization is terminated or revoked sooner. Performed at [facility], [place] [address], [place], [place] [address]          Radiology Studies: Ct Abdomen Pelvis W Contrast  Result Date: 02/11/2019 CLINICAL DATA:  Abdominal pain, history of ulcerative colitis EXAM: CT ABDOMEN AND PELVIS WITH CONTRAST TECHNIQUE: Multidetector CT imaging of the abdomen and pelvis was performed using the standard protocol following bolus administration of intravenous contrast. CONTRAST:  139m OMNIPAQUE IOHEXOL 300 MG/ML  SOLN COMPARISON:  None. FINDINGS: Lower chest: The visualized heart size within normal limits. No pericardial fluid/thickening. No hiatal hernia. The visualized portions of the lungs are clear. Hepatobiliary: There is diffuse low density seen throughout the liver parenchyma.The main portal vein is patent. No evidence of calcified gallstones, gallbladder wall thickening or biliary dilatation. Pancreas: Unremarkable. No pancreatic ductal dilatation or surrounding inflammatory changes. Spleen: Normal in size without focal abnormality. Adrenals/Urinary Tract: Both adrenal glands appear normal. The kidneys and collecting system appear normal without evidence of urinary tract calculus or hydronephrosis. Bladder is unremarkable. Stomach/Bowel: The stomach and small bowel are normal in appearance. There is diffuse bowel wall thickening seen throughout the entirety of the colon with bowel wall  edema and hyperenhancement. There is slight mesenteric hyperemia seen around the sigmoid colon. There is a focal area of narrowing seen within the transverse colon best seen on series 6, image 27. Vascular/Lymphatic: There are no enlarged mesenteric, retroperitoneal, or pelvic lymph nodes. No significant vascular findings are present. Reproductive: The prostate is unremarkable. Other: No evidence of abdominal wall mass or hernia. Musculoskeletal: No acute or significant osseous findings. IMPRESSION: 1. Diffuse bowel wall thickening, hyperenhancement, and edema involving the entirety of the colon, consistent with the patient's history of ulcerative colitis. There is a probable focal area of narrowing seen in the transverse colon. 2. No evidence of bowel obstruction. 3. Hepatic steatosis. Electronically Signed   By: BPrudencio PairM.D.   On: 02/11/2019 00:59   Dg Abd Acute 2+v W 1v Chest  Result Date: 02/10/2019 CLINICAL DATA:  Abdominal pain EXAM: DG ABDOMEN ACUTE W/ 1V CHEST COMPARISON:  None. FINDINGS: There are dilated loops of small bowel predominantly within the left hemiabdomen. There is no free intraperitoneal air. Lungs are clear. Normal cardiomediastinal contours. IMPRESSION: 1. Dilated loops of small bowel in the left hemiabdomen, compatible with small bowel obstruction. No free intraperitoneal air. 2. Clear lungs. Electronically Signed   By: KUlyses JarredM.D.   On: 02/10/2019 22:30        Scheduled Meds: . folic acid  1 mg Oral Daily  . LORazepam  0-4 mg Intravenous Q6H   Followed by  . [START ON 02/13/2019] LORazepam  0-4 mg Intravenous Q12H  . methylPREDNISolone (SOLU-MEDROL) injection  40 mg Intravenous Q24H  . multivitamin with minerals  1 tablet Oral Daily  . pantoprazole  40 mg Oral Q1200  . sodium chloride flush  3 mL Intravenous Once  . thiamine  100 mg Oral Daily   Continuous Infusions:    LOS: 1 day     Vernell Leep, MD, FACP, [facility]. [facility]  To contact  the attending provider between 7A-7P or the covering provider during after hours 7P-7A, please log into the web site [email or web address] and access using universal [facility] password for that web site. If you do not have the password, please call the hospital operator.  02/12/2019, 11:02 AM

## 2019-02-12 NOTE — Progress Notes (Signed)
Transfusion of blood was started, no transfusion reaction noted. Vital signs within normal limits.

## 2019-02-12 NOTE — TOC Initial Note (Signed)
Transition of Care [facility]) - Initial/Assessment Note    Patient Details  Name: Charles Hardy MRN: [id number] Date of Birth: 1976/04/07  [facility] CM/SW Contact:    Pollie Friar, RN Phone Number: 02/12/2019, 4:20 PM  Clinical Narrative:                 Pt without a PCP.  CM was able to get him an appt and placed information on the AVS.  CM provided him alcohol resources for inpt/ outpatient/ on line counseling.  Wife at the bedside and will provide transportation home when d/ced.  Expected Discharge Plan: Home/Self Care Barriers to Discharge: Continued Medical Work up   Patient Goals and CMS Choice        Expected Discharge Plan and Services Expected Discharge Plan: Home/Self Care   Discharge Planning Services: CM Consult                                          Prior Living Arrangements/Services   Lives with:: Spouse Patient language and need for interpreter reviewed:: Yes(no needs) Do you feel safe going back to the place where you live?: Yes            Criminal Activity/Legal Involvement Pertinent to Current Situation/Hospitalization: No - Comment as needed  Activities of Daily Living      Permission Sought/Granted                  Emotional Assessment Appearance:: Appears stated age Attitude/Demeanor/Rapport: Engaged Affect (typically observed): Accepting Orientation: : Oriented to Self, Oriented to Place, Oriented to  Time, Oriented to Situation Alcohol / Substance Use: Alcohol Use(patient provided alcohol counseling resources for online/ outpatient and inpatient) Psych Involvement: No (comment)  Admission diagnosis:  Weakness [R53.1] Weight loss, unintentional [R63.4] Moderate protein-calorie malnutrition (HCC) [E44.0] Severe alcohol dependence (HCC) [F10.20] Ulcerative pancolitis with other complication (HCC) [I78.676] Iron deficiency anemia, unspecified iron deficiency anemia type [D50.9] Alcohol withdrawal ([place])  [F10.239] Patient Active Problem List   Diagnosis Date Noted  . Alcohol withdrawal ([place]) 02/10/2019  . Ulcerative colitis ([place]) 02/10/2019  . Normochromic normocytic anemia 02/10/2019   PCP:  Patient, No Pcp Per Pharmacy:   [facility] [place], [place] [address] [place] [address] Phone: [phone] Fax: [phone]  [id number] [address]Phone: [phone]: [phone]    Social Determinants of Health (SDOH) Interventions    Readmission Risk Interventions No flowsheet data found.

## 2019-02-12 NOTE — Progress Notes (Signed)
Initial Nutrition Assessment  DOCUMENTATION CODES:   Not applicable  INTERVENTION:  Provide Boost Breeze po TID, each supplement provides 250 kcal and 9 grams of protein  Encourage adequate PO intake.   Diet handout regarding Ulcerative Colitis nutrition therapy given.   Recommend obtaining new weight to fully assess weight trends.  NUTRITION DIAGNOSIS:   Inadequate oral intake related to altered GI function as evidenced by per patient/family report.  GOAL:   Patient will meet greater than or equal to 90% of their needs  MONITOR:   PO intake, Supplement acceptance, Skin, Weight trends, Labs, I & O's  REASON FOR ASSESSMENT:   Consult Assessment of nutrition requirement/status  ASSESSMENT:   43 year old married male, works for [employer], PMH of ulcerative colitis, anxiety disorder, alcohol dependence, for worsening UC symptoms. Pt with worsening diarrhea with intermittent blood in stools.Per MD note, pt reports consuming 1/4 gallon vodka a day. Pt currently on CIWA.  Pt reports appetite has been fair currently and PTA. Pt reports scheduled meal intake at home has been sporadic and is usually conflicted by work schedule. Pt reports snacking during work which leads him to overeat at dinner time after work. Pt motivated to set up small frequent meals throughout the day. Pt endorses a 25 lb weight loss with usual body weight of ~175 lbs which he reported last weighing ~2 months ago. Pt with a reported 14% weight weight loss in 2 months which is significant for time frame. Noted no recent weight recorded. Recommend obtaining new weight to fully assess weight trends. RD to order nutritional supplements to aid in caloric and protein needs. Pt reports milk worsens abdominal discomfort. RD to order Boost Breeze instead. Pt and wife at bedside requests handout regarding diet for ulcerative colitis. Handout "Ulcerative Colitis Nutrition Therapy" from the Academy of Nutrition and  Dietetics Manual given. Educated on the importance of adequate caloric, protein, and vitamin/mineral needs.  Unable to complete Nutrition-Focused physical exam at this time.   Labs and medications reviewed.   Diet Order:   Diet Order            Diet regular Room service appropriate? Yes; Fluid consistency: Thin  Diet effective now              EDUCATION NEEDS:    Education needs have been addressed  Skin:  Skin Assessment: Reviewed RN Assessment  Last BM:  10/8  Height:   Ht Readings from Last 1 Encounters:  05/25/15 5' 10"  (1.778 m)    Weight:   Wt Readings from Last 1 Encounters:  05/25/15 89.4 kg    Ideal Body Weight:  75.45 kg  BMI:  There is no height or weight on file to calculate BMI.  Estimated Nutritional Needs:   Kcal:  2050-2300  Protein:  105-115 grams  Fluid:  >/= 2 L/day    Corrin Parker, MS, RD, LDN Pager # [phone] After hours/ weekend pager # [phone]

## 2019-02-13 DIAGNOSIS — F10230 Alcohol dependence with withdrawal, uncomplicated: Secondary | ICD-10-CM

## 2019-02-13 DIAGNOSIS — K5100 Ulcerative (chronic) pancolitis without complications: Secondary | ICD-10-CM

## 2019-02-13 LAB — COMPREHENSIVE METABOLIC PANEL
ALT: 44 U/L (ref 0–44)
AST: 60 U/L — ABNORMAL HIGH (ref 15–41)
Albumin: 2 g/dL — ABNORMAL LOW (ref 3.5–5.0)
Alkaline Phosphatase: 64 U/L (ref 38–126)
Anion gap: 11 (ref 5–15)
BUN: <5 mg/dL — ABNORMAL LOW (ref 6–20)
CO2: 24 mmol/L (ref 22–32)
Calcium: 8.1 mg/dL — ABNORMAL LOW (ref 8.9–10.3)
Chloride: 100 mmol/L (ref 98–111)
Creatinine, Ser: 0.72 mg/dL (ref 0.61–1.24)
GFR calc Af Amer: >60 mL/min (ref >60)
GFR calc non Af Amer: >60 mL/min (ref >60)
Glucose, Bld: 142 mg/dL — ABNORMAL HIGH (ref 70–99)
Potassium: 4.3 mmol/L (ref 3.5–5.1)
Sodium: 135 mmol/L (ref 135–145)
Total Bilirubin: 0.6 mg/dL (ref 0.3–1.2)
Total Protein: 6.3 g/dL — ABNORMAL LOW (ref 6.5–8.1)

## 2019-02-13 LAB — CBC
HCT: 25.1 % — ABNORMAL LOW (ref 39.0–52.0)
Hemoglobin: 8.2 g/dL — ABNORMAL LOW (ref 13.0–17.0)
MCH: 28.2 pg (ref 26.0–34.0)
MCHC: 32.7 g/dL (ref 30.0–36.0)
MCV: 86.3 fL (ref 80.0–100.0)
Platelets: 231 10*3/uL (ref 150–400)
RBC: 2.91 MIL/uL — ABNORMAL LOW (ref 4.22–5.81)
RDW: 22.4 % — ABNORMAL HIGH (ref 11.5–15.5)
WBC: 3.2 10*3/uL — ABNORMAL LOW (ref 4.0–10.5)
nRBC: 0 % (ref 0.0–0.2)

## 2019-02-13 MED ORDER — FOLIC ACID 1 MG PO TABS: 1.0000 mg | ORAL_TABLET | Freq: Every day | ORAL | 0 refills | Status: DC

## 2019-02-13 MED ORDER — THIAMINE HCL 100 MG PO TABS: 100.0000 mg | ORAL_TABLET | Freq: Every day | ORAL | 0 refills | Status: DC

## 2019-02-13 MED ORDER — FERROUS SULFATE 325 (65 FE) MG PO TABS: 325.0000 mg | ORAL_TABLET | Freq: Two times a day (BID) | ORAL | 0 refills | Status: DC

## 2019-02-13 MED ORDER — PREDNISONE 20 MG PO TABS: ORAL_TABLET | ORAL | 0 refills | Status: DC

## 2019-02-13 NOTE — Discharge Summary (Addendum)
Physician Discharge Summary  Charles Hardy JQZ:[id number] DOB: 1975-07-08  PCP: Patient, No Pcp Per  Admitted from: Home Discharged to: Home  Admit date: 02/10/2019 Discharge date: 02/13/2019  Recommendations for Outpatient Follow-up:   Follow-up Information    [facility] Follow up on 02/25/2019.   Why: Your appt is at 2:50 pm. Please arrive early and bring picture ID, insurance card and current medications.  To be seen with repeat labs (CBC & CMP). Contact information: [address] [place] [address]       Charles Hardy, Twana First, MD. Schedule an appointment as soon as possible for a visit in 2 week(s).   Specialty: Gastroenterology Contact information: [address] [place] [address] [phone]         Follow TPMT results that were sent from the hospital. GI recommends getting Humira trough/antibody in 4 weeks just before the second dose.   Home Health: None Equipment/Devices: None  Discharge Condition: Improved and stable CODE STATUS: Full Diet recommendation: Heart healthy  Discharge Diagnoses:  Principal Problem:   Alcohol withdrawal ([place]) Active Problems:   Ulcerative colitis (HCC)   Normochromic normocytic anemia   Iron deficiency anemia   Moderate protein-calorie malnutrition (HCC)   Brief Summary: 43 year old married male, works for [employer], PMH of ulcerative colitis diagnosed approximately a year ago on Humira, follows with Dr. Alan Hardy, GI at [facility] in [place], anxiety disorder, alcohol dependence, for worsening UC symptoms underwent colonoscopy 9/25 which showed findings of active diffuse UC, started oral prednisone 3 days PTA with little improvement, ongoing heavy alcohol use for "self-medication" presented to [facility] ED on 10/7 due to ongoing profuse diarrhea, intermittent blood in stools, concern regarding alcohol issues and malnutrition.  Admitted for alcohol intoxication  with early withdrawal and ulcerative colitis exacerbation.  [facility] consulted.    Assessment & Plan:  Ulcerative colitis exacerbation  Reportedly diagnosed approximately a year ago and has been on Humira every 2 weeks.  Was doing better until mid summer when started having progressively worsening diarrhea with intermittent blood in stools.  S/p colonoscopy 9/25 by Dr. Alan Hardy, [facility] in [facility] which showed diffuse active ulcerative colitis.    As per patient/spouse's report, stool was tested and negative for "infection".  Started on p.o. prednisone 40 mg daily 3 days PTA.  On admission had little improvement with ongoing very frequent small-volume diarrhea with some decrease in blood in stools.  Patient and spouse frustrated regarding lack of control of his UC and exploring options for evaluation at [facility], limited and restricted due to ongoing [place] pandemic.  CT abdomen, pelvis with contrast 10/8: Diffuse bowel wall thickening, hyperenhancement and edema involving the entire colon consistent with patient's history of ulcerative colitis.  There is a probable focal area of narrowing seen in transverse colon but no evidence of bowel obstruction.  [facility] was consulted and indicated that he had pan ulcerative colitis with exacerbation.  Prednisone was switched to IV Solu-Medrol 20 mg every 8 hourly for about 48 hours.  CRP <0.8, ESR 1.  Clinically improved with decreased stool frequency, no blood in stools and no abdominal pain.  Communicated with GI who have seen him today and cleared him for discharge on slow oral prednisone taper as indicated below, continue Humira 40 mg every 2 weeks, recommend getting trough/antibody in 4 weeks just before the second dose, follow T PMT sent from the hospital as outpatient, patient reportedly is in the  process for getting appointment at IBD clinic at [facility].  Alcohol dependence/intoxication/early withdrawal.  Reportedly had  been drinking up to half a gallon of liquor daily for several months for "self-medicating" for GI symptoms.  Last alcohol intake was on day of admission.  BAL on admission: 327.  Treated per CIWA protocol.  Alcohol withdrawal symptoms and signs have resolved. CIWA scores in the last 24 hours are 0.  Alcohol abstinence was counseled.  Continue thiamine, folate and multiple vitamins at least for a couple of weeks.  Anemia, likely chronic disease and iron deficiency  Baseline hemoglobin in the 9 g range.  On initial arrival, hemoglobin 9.2 which has dropped to 7.1, repeated and verified.  Likely multifactorial due to chronic disease, iron deficiency (as per lab work done as outpatient) and slow GI blood loss.  Hemoglobin dropped to 6.7 on 10/9.    Improved after 1 unit of PRBC transfusion on 10/9, hemoglobin up to 8.6 > 8.2 today.  Mild leukopenia may also be related to alcohol toxic effect.  As discussed with GI, will start iron supplements.  Recommend follow-up of CBC closely as outpatient.  Inadequate oral intake  Reported poor oral intake and approximately 25 pound weight loss over the last couple of months.  Dietitian consultation was appreciated.  No malnutrition diagnosis made.  Recommended nutritional supplements.  Sinus tachycardia and high blood pressure  No known history of hypertension  Likely related to alcohol withdrawal, treat likewise and monitor closely.  Improved or even resolved.  Anxiety disorder  Continue prior Xanax and counseled that this should not be taken along with alcohol.  Abnormal LFTs  Mild transaminitis, likely related to alcohol dependence.    Continue to improve   Consultants:  [facility]  Procedures:  None.  Discharge Instructions  Discharge Instructions    Call MD for:  difficulty breathing, headache or visual disturbances   Complete by: As directed    Call MD for:  extreme fatigue   Complete by: As directed    Call  MD for:  persistant dizziness or light-headedness   Complete by: As directed    Call MD for:  persistant nausea and vomiting   Complete by: As directed    Call MD for:  severe uncontrolled pain   Complete by: As directed    Call MD for:  temperature >100.4   Complete by: As directed    Diet - low sodium heart healthy   Complete by: As directed    Increase activity slowly   Complete by: As directed        Medication List    STOP taking these medications   diphenhydrAMINE 50 MG tablet Commonly known as: BENADRYL   ranitidine 150 MG capsule Commonly known as: ZANTAC     TAKE these medications   ALPRAZolam 0.25 MG tablet Commonly known as: XANAX Take 1 tablet (0.25 mg total) by mouth 3 (three) times daily as needed for anxiety.   EpiPen 2-Pak 0.3 mg/0.3 mL Soaj injection Generic drug: EPINEPHrine USE AS DIRECTED   ferrous sulfate 325 (65 FE) MG tablet Take 1 tablet (325 mg total) by mouth 2 (two) times daily with a meal.   folic acid 1 MG tablet Commonly known as: FOLVITE Take 1 tablet (1 mg total) by mouth daily. Start taking on: February 14, 2019   Humira 40 MG/0.4ML Pskt Generic drug: Adalimumab Inject 40 mg into the skin every 14 (fourteen) days.   multivitamin capsule Take 1 capsule by mouth daily.  omeprazole 40 MG capsule Commonly known as: PRILOSEC Take 40 mg by mouth daily.   predniSONE 20 MG tablet Commonly known as: DELTASONE Take 2 tablets daily (40 mg total) for 2 weeks, then one and a half tablets daily (30 mg total) for 2 weeks, then 1 tablet daily (20 mg total) for 2 weeks, then half a tablet daily (10 mg total) for 2 weeks, then stop. What changed:   how much to take  how to take this  when to take this  additional instructions   thiamine 100 MG tablet Take 1 tablet (100 mg total) by mouth daily. Start taking on: February 14, 2019      Allergies  Allergen Reactions  . Bee Venom Anaphylaxis      Procedures/Studies: Ct Abdomen  Pelvis W Contrast  Result Date: 02/11/2019 CLINICAL DATA:  Abdominal pain, history of ulcerative colitis EXAM: CT ABDOMEN AND PELVIS WITH CONTRAST TECHNIQUE: Multidetector CT imaging of the abdomen and pelvis was performed using the standard protocol following bolus administration of intravenous contrast. CONTRAST:  129m OMNIPAQUE IOHEXOL 300 MG/ML  SOLN COMPARISON:  None. FINDINGS: Lower chest: The visualized heart size within normal limits. No pericardial fluid/thickening. No hiatal hernia. The visualized portions of the lungs are clear. Hepatobiliary: There is diffuse low density seen throughout the liver parenchyma.The main portal vein is patent. No evidence of calcified gallstones, gallbladder wall thickening or biliary dilatation. Pancreas: Unremarkable. No pancreatic ductal dilatation or surrounding inflammatory changes. Spleen: Normal in size without focal abnormality. Adrenals/Urinary Tract: Both adrenal glands appear normal. The kidneys and collecting system appear normal without evidence of urinary tract calculus or hydronephrosis. Bladder is unremarkable. Stomach/Bowel: The stomach and small bowel are normal in appearance. There is diffuse bowel wall thickening seen throughout the entirety of the colon with bowel wall edema and hyperenhancement. There is slight mesenteric hyperemia seen around the sigmoid colon. There is a focal area of narrowing seen within the transverse colon best seen on series 6, image 27. Vascular/Lymphatic: There are no enlarged mesenteric, retroperitoneal, or pelvic lymph nodes. No significant vascular findings are present. Reproductive: The prostate is unremarkable. Other: No evidence of abdominal wall mass or hernia. Musculoskeletal: No acute or significant osseous findings. IMPRESSION: 1. Diffuse bowel wall thickening, hyperenhancement, and edema involving the entirety of the colon, consistent with the patient's history of ulcerative colitis. There is a probable focal area  of narrowing seen in the transverse colon. 2. No evidence of bowel obstruction. 3. Hepatic steatosis. Electronically Signed   By: BPrudencio PairM.D.   On: 02/11/2019 00:59   Dg Abd Acute 2+v W 1v Chest  Result Date: 02/10/2019 CLINICAL DATA:  Abdominal pain EXAM: DG ABDOMEN ACUTE W/ 1V CHEST COMPARISON:  None. FINDINGS: There are dilated loops of small bowel predominantly within the left hemiabdomen. There is no free intraperitoneal air. Lungs are clear. Normal cardiomediastinal contours. IMPRESSION: 1. Dilated loops of small bowel in the left hemiabdomen, compatible with small bowel obstruction. No free intraperitoneal air. 2. Clear lungs. Electronically Signed   By: KUlyses JarredM.D.   On: 02/10/2019 22:30      Subjective: Overall feels much better.  Slept well last night after a dose of trazodone.  Indicates that he had 7 small volume loose BMs all day yesterday without blood in stools, abdominal pain, nausea or vomiting.  No BMs overnight.  Denies any other complaints.  As per RN no acute issues noted.  Discharge Exam:  Vitals:   02/12/19 2001 02/12/19 2343  02/13/19 0423 02/13/19 0928  BP: 129/81 108/74 111/83 (!) 125/93  Pulse: (!) 140 (!) 126 (!) 105 100  Resp: _0 Temp: (!) 97.5 F (36.4 C) 98.9 F (37.2 C) 98 F (36.7 C) (!) 97.5 F (36.4 C)  TempSrc: Oral Oral Oral Oral  SpO2: 98% 93% 96% 99%    General exam: Pleasant young male, moderately built and thinly nourished sitting up comfortably in bed without distress.  Overall much improved compared to 2 days ago.  Oral mucosa moist. Respiratory system: Clear to auscultation.  No increased work of breathing. Cardiovascular system: S1 and S2 heard, RRR.  No JVD, murmurs or pedal edema.  Gastrointestinal system: Abdomen is nondistended, soft and nontender.  No organomegaly or masses appreciated.  Normal bowel sounds heard. Central nervous system: Alert and oriented. No focal neurological deficits. Extremities: Symmetric 5  x 5 power. Skin: No rashes, lesions or ulcers Psychiatry: Judgement and insight appear normal. Mood & affect pleasant and appropriate.  Does not appear anxious or tremulous.     The results of significant diagnostics from this hospitalization (including imaging, microbiology, ancillary and laboratory) are listed below for reference.     Microbiology: Recent Results (from the past 240 hour(s))  SARS CORONAVIRUS 2 (TAT 6-24 HRS) Nasopharyngeal Nasopharyngeal Swab     Status: None   Collection Time: 02/10/19  9:45 PM   Specimen: Nasopharyngeal Swab  Result Value Ref Range Status   SARS Coronavirus 2 NEGATIVE NEGATIVE Final    Comment: (NOTE) SARS-CoV-2 target nucleic acids are NOT DETECTED. The SARS-CoV-2 RNA is generally detectable in upper and lower respiratory specimens during the acute phase of infection. Negative results do not preclude SARS-CoV-2 infection, do not rule out co-infections with other pathogens, and should not be used as the sole basis for treatment or other patient management decisions. Negative results must be combined with clinical observations, patient history, and epidemiological information. The expected result is Negative. Fact Sheet for Patients: SugarRoll.be Fact Sheet for Healthcare Providers: https://www.woods-mathews.com/ This test is not yet approved or cleared by the [place] FDA and  has been authorized for detection and/or diagnosis of SARS-CoV-2 by FDA under an Emergency Use Authorization (EUA). This EUA will remain  in effect (meaning this test can be used) for the duration of the COVID-19 declaration under Section 56 4(b)(1) of the Act, 21 U.S.C. section 360bbb-3(b)(1), unless the authorization is terminated or revoked sooner. Performed at [facility], [place] [address] [place], [place] [address]      Labs: CBC: Recent Labs  Lab 02/11/19 0708 02/11/19 1700 02/12/19 0331  02/12/19 1154 02/13/19 0352  WBC 10.4 8.0 6.9 9.0 3.2*  HGB 7.1* 7.8* 6.7* 8.6* 8.2*  HCT 22.6* 24.6* 22.7* 26.6* 25.1*  MCV 87.6 88.2 87.6 86.6 86.3  PLT 292 273 256 251 034   Basic Metabolic Panel: Recent Labs  Lab 02/10/19 1856 02/11/19 0112 02/12/19 0331 02/13/19 0352  NA 140 140 136 135  K 4.1 3.9 4.2 4.3  CL 103 108 101 100  CO2 21* 19* 26 24  GLUCOSE 127* 73 116* 142*  BUN 6 7 <5* <5*  CREATININE 0.79 0.77 0.71 0.72  CALCIUM 7.7* 6.9* 8.0* 8.1*   Liver Function Tests: Recent Labs  Lab 02/10/19 1856 02/11/19 0112 02/12/19 0331 02/13/19 0352  AST 149* 115* 74* 60*  ALT 75* 57* 50* 44  ALKPHOS 109 78 74 64  BILITOT 0.5 0.4 0.7 0.6  PROT 7.0 5.7* 5.7* 6.3*  ALBUMIN 2.3* 1.9* 1.8* 2.0*   Urinalysis    Component Value Date/Time   COLORURINE YELLOW 02/10/2019 1924   APPEARANCEUR HAZY (A) 02/10/2019 1924   LABSPEC 1.019 02/10/2019 1924   PHURINE 5.0 02/10/2019 1924   GLUCOSEU NEGATIVE 02/10/2019 1924   HGBUR NEGATIVE 02/10/2019 1924   BILIRUBINUR NEGATIVE 02/10/2019 1924   KETONESUR 20 (A) 02/10/2019 1924   PROTEINUR 30 (A) 02/10/2019 1924   NITRITE NEGATIVE 02/10/2019 1924   LEUKOCYTESUR NEGATIVE 02/10/2019 1924      Time coordinating discharge: 40 minutes  SIGNED:  Vernell Leep, MD, FACP, [facility]. [facility]  To contact the attending provider between 7A-7P or the covering provider during after hours 7P-7A, please log into the web site [email or web address] and access using universal [place] password for that web site. If you do not have the password, please call the hospital operator.

## 2019-02-13 NOTE — Discharge Instructions (Signed)

## 2019-02-13 NOTE — Progress Notes (Signed)
     Progress Note    ASSESSMENT AND PLAN:   1.  Pan ulcerative colitis with exacerbation. 2.  EtOH dependence/fatty liver.  No cirrhosis. 3.  Situational anxiety.  Plan: -Continue slow prednisone taper- 40 mg p.o. once a day for 2 weeks, 30 mg p.o. once a day for 2 weeks, followed by 20 mg p.o. once a day for 2 weeks, then 10 mg p.o. once a day for 2 weeks and then stop.  Discussed side effects. -Follow T PMT -Continue Humira 40 every 2 weeks.  Recommend getting trough/antibody in 4 weeks, just before the second dose. -FU with Dr. [name] in 2 weeks. -In process for getting appointment at [facility]. -I also discussed in detail regarding immunomodulator therapy. -Stop all alcohol. -We will sign off for now.       SUBJECTIVE   Feels much better this morning. No hematochezia. No abdominal pain Eating very well. No nausea or vomiting.    OBJECTIVE:     Vital signs in last 24 hours: Temp:  [97.5 F (36.4 C)-99.1 F (37.3 C)] 97.5 F (36.4 C) (10/10 0928) Pulse Rate:  [100-143] 100 (10/10 0928) Resp:  [17-20] 18 (10/10 0928) BP: (108-144)/(74-102) 125/93 (10/10 0928) SpO2:  [93 %-100 %] 99 % (10/10 0928) Last BM Date: 02/11/19 General:   Alert, well-developed male in NAD EENT:  Normal hearing, non icteric sclera, conjunctive pink.  Heart:  Regular rate and rhythm; no murmur.  No lower extremity edema   Pulm: Normal respiratory effort, lungs CTA bilaterally without wheezes or crackles. Abdomen:  Soft, nondistended, nontender.  Normal bowel sounds,.       Neurologic:  Alert and  oriented x4;  grossly normal neurologically. Psych:  Pleasant, cooperative.  Normal mood and affect.   Intake/Output from previous day: 10/09 0701 - 10/10 0700 In: 640 [P.O.:240; Blood:400] Out: -  Intake/Output this shift: No intake/output data recorded.  Lab Results: Recent Labs    02/12/19 0331 02/12/19 1154 02/13/19 0352  WBC 6.9 9.0 3.2*  HGB 6.7* 8.6* 8.2*  HCT  22.7* 26.6* 25.1*  PLT 256 251 231   BMET Recent Labs    02/11/19 0112 02/12/19 0331 02/13/19 0352  NA 140 136 135  K 3.9 4.2 4.3  CL 108 101 100  CO2 19* 26 24  GLUCOSE 73 116* 142*  BUN 7 <5* <5*  CREATININE 0.77 0.71 0.72  CALCIUM 6.9* 8.0* 8.1*   LFT Recent Labs    02/11/19 0112  02/13/19 0352  PROT 5.7*   < > 6.3*  ALBUMIN 1.9*   < > 2.0*  AST 115*   < > 60*  ALT 57*   < > 44  ALKPHOS 78   < > 64  BILITOT 0.4   < > 0.6  BILIDIR <0.1  --   --   IBILI NOT CALCULATED  --   --    < > = values in this interval not displayed.   PT/INR No results for input(s): LABPROT, INR in the last 72 hours. Hepatitis Panel No results for input(s): HEPBSAG, HCVAB, HEPAIGM, HEPBIGM in the last 72 hours.  No results found.   Principal Problem:   Alcohol withdrawal ([place]) Active Problems:   Ulcerative colitis ([place])   Normochromic normocytic anemia   Iron deficiency anemia   Moderate protein-calorie malnutrition ([place])     LOS: 2 days     Carmell Austria, MD 02/13/2019, 11:16 AM [name] [phone]

## 2019-02-13 NOTE — Progress Notes (Signed)
Patient ready for discharge to home; discharge instructions given and reviewed; Rx's sent electronically.  Patient discharged out via wheelchair accompanied home by his wife.

## 2019-02-14 LAB — TYPE AND SCREEN
ABO/RH(D): O NEG
Antibody Screen: NEGATIVE
Unit division: 0
Unit division: 0

## 2019-02-14 LAB — BPAM RBC
Blood Product Expiration Date: 202010122359
Blood Product Expiration Date: 202010122359
ISSUE DATE / TIME: 202010090545
Unit Type and Rh: 9500
Unit Type and Rh: 9500

## 2019-02-17 ENCOUNTER — Telehealth: Payer: Self-pay

## 2019-02-17 NOTE — Telephone Encounter (Signed)
Documents were dropped off for patient to see about being seen by Dr. Lyndel Safe. Doctor review records and said that he needs to follow up with [facility]  Phone call was made but no one picked up

## 2019-02-18 LAB — THIOPURINE METHYLTRANSFERASE (TPMT), RBC: TPMT Activity:: 32.3 Units/mL RBC

## 2019-02-19 NOTE — Telephone Encounter (Signed)
Pt's wife called and was given Dr. Steve Rattler message.

## 2019-02-24 DIAGNOSIS — F411 Generalized anxiety disorder: Secondary | ICD-10-CM | POA: Diagnosis not present

## 2019-02-25 ENCOUNTER — Ambulatory Visit (INDEPENDENT_AMBULATORY_CARE_PROVIDER_SITE_OTHER): Payer: BC Managed Care – PPO | Admitting: Family Medicine

## 2019-02-25 DIAGNOSIS — K5100 Ulcerative (chronic) pancolitis without complications: Secondary | ICD-10-CM | POA: Diagnosis not present

## 2019-02-25 DIAGNOSIS — D500 Iron deficiency anemia secondary to blood loss (chronic): Secondary | ICD-10-CM

## 2019-02-25 DIAGNOSIS — F419 Anxiety disorder, unspecified: Secondary | ICD-10-CM

## 2019-02-25 NOTE — Progress Notes (Signed)
Following up from recent hospital visit. States that he is feeling better from UC flare.

## 2019-02-25 NOTE — Progress Notes (Signed)
Virtual Visit via Telephone Note  I connected with Brigid Re, on 02/25/2019 at 3:07 PM by telephone due to the COVID-19 pandemic and verified that I am speaking with the correct person using two identifiers.   Consent: I discussed the limitations, risks, security and privacy concerns of performing an evaluation and management service by telephone and the availability of in person appointments. I also discussed with the patient that there may be a patient responsible charge related to this service. The patient expressed understanding and agreed to proceed.   Location of Patient: Home  Location of Provider: Clinic   Persons participating in Telemedicine visit: Rosemary Pentecost [facility] Dr. Felecia Shelling     History of Present Illness: 43 year old male with a history of ulcerative colitis, anxiety, alcohol abuse seen today for follow-up from hospitalization at [facility] from 02/10/2019-02/13/2019 for ulcerative colitis flare. He had been on Humira but symptoms had not been controlled which led to his presentation. He was admitted for alcohol intoxication and withdrawal and ulcerative colitis exacerbation; he had used alcohol for self-medication. He was treated with IV Solu-Medrol and followed by GI. CT abdomen and pelvis revealed: IMPRESSION: 1. Diffuse bowel wall thickening, hyperenhancement, and edema involving the entirety of the colon, consistent with the patient's history of ulcerative colitis. There is a probable focal area of narrowing seen in the transverse colon. 2. No evidence of bowel obstruction. 3. Hepatic steatosis.  Today he feels better compared to his hospitalization and has not had any alcohol since discharge. Denies hematochezia but has black stools which he thinks is from his iron pills; also has constipation. Appointment with his GI at [facility] comes up next week and GI at [place] is on [facility] is on 03/15/19. He is out of work at this time. He  has anxiety, gets stressed easily and was prescribed Xanax by a previous Physician which he rarely uses.  We have discussed other modalities besides benzodiazepines but he feels he is not ready for medications at this time.  Past Medical History:  Diagnosis Date  . Allergy   . Anxiety   . Ulcerative colitis ([place])    Allergies  Allergen Reactions  . Bee Venom Anaphylaxis    Current Outpatient Medications on File Prior to Visit  Medication Sig Dispense Refill  . Adalimumab (HUMIRA) 40 MG/0.4ML PSKT Inject 40 mg into the skin every 14 (fourteen) days.    . ferrous sulfate 325 (65 FE) MG tablet Take 1 tablet (325 mg total) by mouth 2 (two) times daily with a meal. 60 tablet 0  . folic acid (FOLVITE) 1 MG tablet Take 1 tablet (1 mg total) by mouth daily. 30 tablet 0  . Multiple Vitamin (MULTIVITAMIN) capsule Take 1 capsule by mouth daily.    Marland Kitchen omeprazole (PRILOSEC) 40 MG capsule Take 40 mg by mouth daily.    . predniSONE (DELTASONE) 20 MG tablet Take 2 tablets daily (40 mg total) for 2 weeks, then one and a half tablets daily (30 mg total) for 2 weeks, then 1 tablet daily (20 mg total) for 2 weeks, then half a tablet daily (10 mg total) for 2 weeks, then stop. 70 tablet 0  . thiamine 100 MG tablet Take 1 tablet (100 mg total) by mouth daily. 30 tablet 0   No current facility-administered medications on file prior to visit.     Observations/Objective: Awake, alert, oriented x3 Not in acute distress  CMP Latest Ref Rng & Units 02/13/2019 02/12/2019 02/11/2019  Glucose 70 -  99 mg/dL 142(H) 116(H) 73  BUN 6 - 20 mg/dL <5(L) <5(L) 7  Creatinine 0.61 - 1.24 mg/dL 0.72 0.71 0.77  Sodium 135 - 145 mmol/L 135 136 140  Potassium 3.5 - 5.1 mmol/L 4.3 4.2 3.9  Chloride 98 - 111 mmol/L 100 101 108  CO2 22 - 32 mmol/L 24 26 19(L)  Calcium 8.9 - 10.3 mg/dL 8.1(L) 8.0(L) 6.9(L)  Total Protein 6.5 - 8.1 g/dL 6.3(L) 5.7(L) 5.7(L)  Total Bilirubin 0.3 - 1.2 mg/dL 0.6 0.7 0.4  Alkaline Phos 38 - 126  U/L 64 74 78  AST 15 - 41 U/L 60(H) 74(H) 115(H)  ALT 0 - 44 U/L 44 50(H) 57(H)    CBC    Component Value Date/Time   WBC 3.2 (L) 02/13/2019 0352   RBC 2.91 (L) 02/13/2019 0352   HGB 8.2 (L) 02/13/2019 0352   HCT 25.1 (L) 02/13/2019 0352   PLT 231 02/13/2019 0352   MCV 86.3 02/13/2019 0352   MCH 28.2 02/13/2019 0352   MCHC 32.7 02/13/2019 0352   RDW 22.4 (H) 02/13/2019 0352    Assessment and Plan: 1. Ulcerative pancolitis without complication (HCC) Symptoms are stable Currently on prednisone taper and Humira Keep appointment with GI He will need repeat CBC at GI visit next week  2. Anxiety Currently not on medication We have discussed hydroxyzine as needed for symptoms versus SSRI He will think about this and inform me if he chooses to proceed with medications  3 Iron deficiency anemia due to blood loss Last hemoglobin was 8.2 Currently on ferrous sulfate Repeat A1c at GI visit next week.  Follow Up Instructions: Return in about 1 month (around 03/28/2019) for complete physical exam.    I discussed the assessment and treatment plan with the patient. The patient was provided an opportunity to ask questions and all were answered. The patient agreed with the plan and demonstrated an understanding of the instructions.   The patient was advised to call back or seek an in-person evaluation if the symptoms worsen or if the condition fails to improve as anticipated.     I provided 31 minutes total of non-face-to-face time during this encounter including median intraservice time, reviewing previous notes, labs, imaging, medications, management and patient verbalized understanding.     Charlott Rakes, MD, FAAFP. [facility] [place], [place]   02/25/2019, 3:07 PM

## 2019-03-01 DIAGNOSIS — Z7189 Other specified counseling: Secondary | ICD-10-CM | POA: Diagnosis not present

## 2019-03-01 DIAGNOSIS — K5190 Ulcerative colitis, unspecified, without complications: Secondary | ICD-10-CM | POA: Diagnosis not present

## 2019-03-03 DIAGNOSIS — F411 Generalized anxiety disorder: Secondary | ICD-10-CM | POA: Diagnosis not present

## 2019-03-15 DIAGNOSIS — K5100 Ulcerative (chronic) pancolitis without complications: Secondary | ICD-10-CM | POA: Diagnosis not present

## 2019-04-03 ENCOUNTER — Emergency Department (HOSPITAL_COMMUNITY): Payer: BC Managed Care – PPO

## 2019-04-03 ENCOUNTER — Other Ambulatory Visit: Payer: Self-pay

## 2019-04-03 ENCOUNTER — Inpatient Hospital Stay (HOSPITAL_COMMUNITY)
Admission: EM | Admit: 2019-04-03 | Discharge: 2019-04-07 | DRG: 439 | Disposition: A | Discharge status: Home / Self Care | Payer: BC Managed Care – PPO | Attending: Family Medicine | Admitting: Family Medicine

## 2019-04-03 ENCOUNTER — Encounter (HOSPITAL_COMMUNITY): Payer: Self-pay | Admitting: *Deleted

## 2019-04-03 DIAGNOSIS — K852 Alcohol induced acute pancreatitis without necrosis or infection: Secondary | ICD-10-CM | POA: Diagnosis not present

## 2019-04-03 DIAGNOSIS — K5900 Constipation, unspecified: Secondary | ICD-10-CM | POA: Diagnosis not present

## 2019-04-03 DIAGNOSIS — I480 Paroxysmal atrial fibrillation: Secondary | ICD-10-CM | POA: Diagnosis present

## 2019-04-03 DIAGNOSIS — Z8349 Family history of other endocrine, nutritional and metabolic diseases: Secondary | ICD-10-CM

## 2019-04-03 DIAGNOSIS — Z03818 Encounter for observation for suspected exposure to other biological agents ruled out: Secondary | ICD-10-CM | POA: Diagnosis not present

## 2019-04-03 DIAGNOSIS — Z79899 Other long term (current) drug therapy: Secondary | ICD-10-CM

## 2019-04-03 DIAGNOSIS — K838 Other specified diseases of biliary tract: Secondary | ICD-10-CM | POA: Diagnosis not present

## 2019-04-03 DIAGNOSIS — F419 Anxiety disorder, unspecified: Secondary | ICD-10-CM | POA: Diagnosis not present

## 2019-04-03 DIAGNOSIS — K5190 Ulcerative colitis, unspecified, without complications: Secondary | ICD-10-CM | POA: Diagnosis not present

## 2019-04-03 DIAGNOSIS — Z6826 Body mass index (BMI) 26.0-26.9, adult: Secondary | ICD-10-CM

## 2019-04-03 DIAGNOSIS — F1010 Alcohol abuse, uncomplicated: Secondary | ICD-10-CM | POA: Diagnosis not present

## 2019-04-03 DIAGNOSIS — M79671 Pain in right foot: Secondary | ICD-10-CM | POA: Diagnosis not present

## 2019-04-03 DIAGNOSIS — I4891 Unspecified atrial fibrillation: Secondary | ICD-10-CM | POA: Diagnosis not present

## 2019-04-03 DIAGNOSIS — E876 Hypokalemia: Secondary | ICD-10-CM | POA: Diagnosis present

## 2019-04-03 DIAGNOSIS — Z833 Family history of diabetes mellitus: Secondary | ICD-10-CM | POA: Diagnosis not present

## 2019-04-03 DIAGNOSIS — K5100 Ulcerative (chronic) pancolitis without complications: Secondary | ICD-10-CM | POA: Diagnosis not present

## 2019-04-03 DIAGNOSIS — R933 Abnormal findings on diagnostic imaging of other parts of digestive tract: Secondary | ICD-10-CM | POA: Diagnosis not present

## 2019-04-03 DIAGNOSIS — E871 Hypo-osmolality and hyponatremia: Secondary | ICD-10-CM | POA: Diagnosis present

## 2019-04-03 DIAGNOSIS — R109 Unspecified abdominal pain: Secondary | ICD-10-CM | POA: Diagnosis not present

## 2019-04-03 DIAGNOSIS — R1084 Generalized abdominal pain: Secondary | ICD-10-CM

## 2019-04-03 DIAGNOSIS — F10239 Alcohol dependence with withdrawal, unspecified: Secondary | ICD-10-CM | POA: Diagnosis not present

## 2019-04-03 DIAGNOSIS — K859 Acute pancreatitis without necrosis or infection, unspecified: Secondary | ICD-10-CM | POA: Diagnosis present

## 2019-04-03 DIAGNOSIS — Z91030 Bee allergy status: Secondary | ICD-10-CM

## 2019-04-03 DIAGNOSIS — K700 Alcoholic fatty liver: Secondary | ICD-10-CM | POA: Diagnosis not present

## 2019-04-03 DIAGNOSIS — K828 Other specified diseases of gallbladder: Secondary | ICD-10-CM | POA: Diagnosis not present

## 2019-04-03 DIAGNOSIS — Z8249 Family history of ischemic heart disease and other diseases of the circulatory system: Secondary | ICD-10-CM | POA: Diagnosis not present

## 2019-04-03 DIAGNOSIS — Z20828 Contact with and (suspected) exposure to other viral communicable diseases: Secondary | ICD-10-CM | POA: Diagnosis not present

## 2019-04-03 DIAGNOSIS — K760 Fatty (change of) liver, not elsewhere classified: Secondary | ICD-10-CM | POA: Diagnosis not present

## 2019-04-03 DIAGNOSIS — E669 Obesity, unspecified: Secondary | ICD-10-CM | POA: Diagnosis not present

## 2019-04-03 HISTORY — DX: Noninfective gastroenteritis and colitis, unspecified: K52.9

## 2019-04-03 LAB — CBC WITH DIFFERENTIAL/PLATELET
Abs Immature Granulocytes: 0.05 10*3/uL (ref 0.00–0.07)
Basophils Absolute: 0 10*3/uL (ref 0.0–0.1)
Basophils Relative: 0 %
Eosinophils Absolute: 0 10*3/uL (ref 0.0–0.5)
Eosinophils Relative: 0 %
HCT: 38.9 % — ABNORMAL LOW (ref 39.0–52.0)
Hemoglobin: 13.4 g/dL (ref 13.0–17.0)
Immature Granulocytes: 1 %
Lymphocytes Relative: 5 %
Lymphs Abs: 0.5 10*3/uL — ABNORMAL LOW (ref 0.7–4.0)
MCH: 31.2 pg (ref 26.0–34.0)
MCHC: 34.4 g/dL (ref 30.0–36.0)
MCV: 90.5 fL (ref 80.0–100.0)
Monocytes Absolute: 1 10*3/uL (ref 0.1–1.0)
Monocytes Relative: 9 %
Neutro Abs: 9.4 10*3/uL — ABNORMAL HIGH (ref 1.7–7.7)
Neutrophils Relative %: 85 %
Platelets: 159 10*3/uL (ref 150–400)
RBC: 4.3 MIL/uL (ref 4.22–5.81)
RDW: 15.7 % — ABNORMAL HIGH (ref 11.5–15.5)
WBC: 11 10*3/uL — ABNORMAL HIGH (ref 4.0–10.5)
nRBC: 0 % (ref 0.0–0.2)

## 2019-04-03 LAB — COMPREHENSIVE METABOLIC PANEL
ALT: 101 U/L — ABNORMAL HIGH (ref 0–44)
AST: 114 U/L — ABNORMAL HIGH (ref 15–41)
Albumin: 2.5 g/dL — ABNORMAL LOW (ref 3.5–5.0)
Alkaline Phosphatase: 108 U/L (ref 38–126)
Anion gap: 25 — ABNORMAL HIGH (ref 5–15)
BUN: <5 mg/dL — ABNORMAL LOW (ref 6–20)
CO2: 19 mmol/L — ABNORMAL LOW (ref 22–32)
Calcium: 8.4 mg/dL — ABNORMAL LOW (ref 8.9–10.3)
Chloride: 82 mmol/L — ABNORMAL LOW (ref 98–111)
Creatinine, Ser: 0.62 mg/dL (ref 0.61–1.24)
GFR calc Af Amer: >60 mL/min (ref >60)
GFR calc non Af Amer: >60 mL/min (ref >60)
Glucose, Bld: 106 mg/dL — ABNORMAL HIGH (ref 70–99)
Potassium: 2.5 mmol/L — CL (ref 3.5–5.1)
Sodium: 126 mmol/L — ABNORMAL LOW (ref 135–145)
Total Bilirubin: 2.1 mg/dL — ABNORMAL HIGH (ref 0.3–1.2)
Total Protein: 6.7 g/dL (ref 6.5–8.1)

## 2019-04-03 LAB — URINALYSIS, ROUTINE W REFLEX MICROSCOPIC
Bilirubin Urine: NEGATIVE
Glucose, UA: NEGATIVE mg/dL
Hgb urine dipstick: NEGATIVE
Ketones, ur: 80 mg/dL — AB
Leukocytes,Ua: NEGATIVE
Nitrite: NEGATIVE
Protein, ur: 30 mg/dL — AB
Specific Gravity, Urine: 1.011 (ref 1.005–1.030)
pH: 6 (ref 5.0–8.0)

## 2019-04-03 LAB — BASIC METABOLIC PANEL
Anion gap: 19 — ABNORMAL HIGH (ref 5–15)
BUN: <5 mg/dL — ABNORMAL LOW (ref 6–20)
CO2: 22 mmol/L (ref 22–32)
Calcium: 7.4 mg/dL — ABNORMAL LOW (ref 8.9–10.3)
Chloride: 89 mmol/L — ABNORMAL LOW (ref 98–111)
Creatinine, Ser: 0.82 mg/dL (ref 0.61–1.24)
GFR calc Af Amer: >60 mL/min (ref >60)
GFR calc non Af Amer: >60 mL/min (ref >60)
Glucose, Bld: 107 mg/dL — ABNORMAL HIGH (ref 70–99)
Potassium: 2.9 mmol/L — ABNORMAL LOW (ref 3.5–5.1)
Sodium: 130 mmol/L — ABNORMAL LOW (ref 135–145)

## 2019-04-03 LAB — LACTIC ACID, PLASMA
Lactic Acid, Venous: 3.2 mmol/L (ref 0.5–1.9)
Lactic Acid, Venous: 1.6 mmol/L (ref 0.5–1.9)

## 2019-04-03 LAB — LIPASE, BLOOD: Lipase: 38 U/L (ref 11–51)

## 2019-04-03 MED ORDER — POTASSIUM CHLORIDE CRYS ER 20 MEQ PO TBCR
40.0000 meq | EXTENDED_RELEASE_TABLET | Freq: Once | ORAL | Status: AC
  Administered 2019-04-03: 40 meq via ORAL
  Filled 2019-04-03: qty 2

## 2019-04-03 MED ORDER — SODIUM CHLORIDE 0.9 % IV BOLUS
1000.0000 mL | Freq: Once | INTRAVENOUS | Status: AC
  Administered 2019-04-03: 1000 mL via INTRAVENOUS

## 2019-04-03 MED ORDER — IOHEXOL 300 MG/ML  SOLN
100.0000 mL | Freq: Once | INTRAMUSCULAR | Status: AC | PRN
  Administered 2019-04-03: 100 mL via INTRAVENOUS

## 2019-04-03 MED ORDER — ONDANSETRON HCL 4 MG/2ML IJ SOLN
INTRAMUSCULAR | Status: AC
  Administered 2019-04-03: 4 mg
  Filled 2019-04-03: qty 2

## 2019-04-03 MED ORDER — THIAMINE HCL 100 MG/ML IJ SOLN
100.0000 mg | Freq: Every day | INTRAMUSCULAR | Status: DC
  Filled 2019-04-03: qty 2

## 2019-04-03 MED ORDER — MAGNESIUM SULFATE 2 GM/50ML IV SOLN
2.0000 g | Freq: Once | INTRAVENOUS | Status: AC
  Administered 2019-04-03: 2 g via INTRAVENOUS
  Filled 2019-04-03: qty 50

## 2019-04-03 MED ORDER — LORAZEPAM 1 MG PO TABS: 1.0000 mg | ORAL_TABLET | ORAL | Status: AC | PRN

## 2019-04-03 MED ORDER — SODIUM CHLORIDE 0.9 % IV SOLN
2.0000 g | Freq: Once | INTRAVENOUS | Status: AC
  Administered 2019-04-03: 2 g via INTRAVENOUS

## 2019-04-03 MED ORDER — DILTIAZEM HCL 25 MG/5ML IV SOLN
15.0000 mg | Freq: Once | INTRAVENOUS | Status: AC
  Administered 2019-04-03: 15 mg via INTRAVENOUS
  Filled 2019-04-03: qty 5

## 2019-04-03 MED ORDER — SODIUM CHLORIDE 0.9 % IV SOLN
2.0000 g | Freq: Once | INTRAVENOUS | Status: DC
  Filled 2019-04-03: qty 20

## 2019-04-03 MED ORDER — LORAZEPAM 2 MG/ML IJ SOLN
0.0000 mg | Freq: Three times a day (TID) | INTRAMUSCULAR | Status: DC
  Administered 2019-04-05 – 2019-04-06 (×2): 2 mg via INTRAVENOUS
  Filled 2019-04-03 (×2): qty 1

## 2019-04-03 MED ORDER — METRONIDAZOLE IN NACL 5-0.79 MG/ML-% IV SOLN
500.0000 mg | Freq: Once | INTRAVENOUS | Status: DC
  Filled 2019-04-03: qty 100

## 2019-04-03 MED ORDER — VITAMIN B-1 100 MG PO TABS
100.0000 mg | ORAL_TABLET | Freq: Every day | ORAL | Status: DC
  Administered 2019-04-04 – 2019-04-07 (×4): 100 mg via ORAL
  Filled 2019-04-03 (×4): qty 1

## 2019-04-03 MED ORDER — ADULT MULTIVITAMIN W/MINERALS CH
1.0000 | ORAL_TABLET | Freq: Every day | ORAL | Status: DC
  Administered 2019-04-04 – 2019-04-07 (×4): 1 via ORAL
  Filled 2019-04-03 (×4): qty 1

## 2019-04-03 MED ORDER — METRONIDAZOLE IN NACL 5-0.79 MG/ML-% IV SOLN
500.0000 mg | Freq: Once | INTRAVENOUS | Status: AC
  Administered 2019-04-03: 500 mg via INTRAVENOUS

## 2019-04-03 MED ORDER — POTASSIUM CHLORIDE 10 MEQ/100ML IV SOLN
10.0000 meq | INTRAVENOUS | Status: AC
  Administered 2019-04-03 (×2): 10 meq via INTRAVENOUS
  Filled 2019-04-03 (×2): qty 100

## 2019-04-03 MED ORDER — SODIUM CHLORIDE 0.9 % IV SOLN
2.0000 g | INTRAVENOUS | Status: DC
  Administered 2019-04-04 – 2019-04-05 (×2): 2 g via INTRAVENOUS
  Filled 2019-04-03: qty 20
  Filled 2019-04-03 (×2): qty 2

## 2019-04-03 MED ORDER — LORAZEPAM 2 MG/ML IJ SOLN
1.0000 mg | INTRAMUSCULAR | Status: AC | PRN
  Filled 2019-04-03: qty 1

## 2019-04-03 MED ORDER — FENTANYL CITRATE (PF) 100 MCG/2ML IJ SOLN
25.0000 ug | INTRAMUSCULAR | Status: DC | PRN
  Administered 2019-04-03: 25 ug via INTRAVENOUS
  Filled 2019-04-03: qty 2

## 2019-04-03 MED ORDER — FOLIC ACID 1 MG PO TABS
1.0000 mg | ORAL_TABLET | Freq: Every day | ORAL | Status: DC
  Administered 2019-04-04 – 2019-04-07 (×4): 1 mg via ORAL
  Filled 2019-04-03 (×4): qty 1

## 2019-04-03 MED ORDER — LORAZEPAM 2 MG/ML IJ SOLN
2.0000 mg | Freq: Once | INTRAMUSCULAR | Status: AC
  Administered 2019-04-03: 2 mg via INTRAVENOUS
  Filled 2019-04-03: qty 1

## 2019-04-03 MED ORDER — MORPHINE SULFATE (PF) 4 MG/ML IV SOLN
4.0000 mg | Freq: Once | INTRAVENOUS | Status: AC
  Administered 2019-04-03: 4 mg via INTRAVENOUS
  Filled 2019-04-03: qty 1

## 2019-04-03 MED ORDER — SODIUM CHLORIDE 0.9 % IV BOLUS: 2000.0000 mL | Freq: Once | INTRAVENOUS | Status: DC

## 2019-04-03 MED ORDER — LORAZEPAM 2 MG/ML IJ SOLN
0.0000 mg | INTRAMUSCULAR | Status: AC
  Administered 2019-04-03: 1 mg via INTRAVENOUS
  Administered 2019-04-05 (×2): 2 mg via INTRAVENOUS
  Filled 2019-04-03 (×2): qty 1
  Filled 2019-04-03: qty 2

## 2019-04-03 MED ORDER — SODIUM CHLORIDE 0.9 % IV BOLUS
1000.0000 mL | Freq: Once | INTRAVENOUS | Status: AC
  Administered 2019-04-03: 18:00:00 1000 mL via INTRAVENOUS

## 2019-04-03 MED ORDER — POTASSIUM CHLORIDE IN NACL 20-0.9 MEQ/L-% IV SOLN
INTRAVENOUS | Status: AC
  Administered 2019-04-04: via INTRAVENOUS
  Filled 2019-04-03: qty 1000

## 2019-04-03 MED ORDER — ONDANSETRON HCL 4 MG/2ML IJ SOLN: 4.0000 mg | Freq: Four times a day (QID) | INTRAMUSCULAR | Status: DC | PRN

## 2019-04-03 MED ORDER — METRONIDAZOLE IN NACL 5-0.79 MG/ML-% IV SOLN
500.0000 mg | Freq: Three times a day (TID) | INTRAVENOUS | Status: DC
  Administered 2019-04-04 – 2019-04-06 (×7): 500 mg via INTRAVENOUS
  Filled 2019-04-03 (×7): qty 100

## 2019-04-03 NOTE — H&P (Signed)
History and Physical    Charles Hardy:[id number] DOB: [birth year] DOA: 04/03/2019  PCP: Patient, No Pcp Per   Patient coming from: Home   Chief Complaint: Abdominal pain   HPI: Charles Hardy is a 43 y.o. male with medical history significant for ulcerative colitis and alcohol dependence, now presenting to the emergency department for evaluation of abdominal pain.  Patient reports approximately 1 week of progressive abdominal pain with loss of appetite.  Pain was initially in the lower quadrants, more recently involving the upper quadrants bilaterally.  He has had a loss of appetite and has not been eating much of anything, but denies nausea, vomiting, or diarrhea.  He also denies fevers or chills.  He recently established with gastroenterology at [facility] and his ulcerative colitis has been managed with Humira.  He denies melena or hematochezia.  He reports daily alcohol use and reports history of withdrawal symptoms if he goes 1 to 2 days without drinking.  He reports undergoing EGD approximately 3 years ago and colonoscopy within the past year.    ED Course: Upon arrival to the ED, patient is found to be afebrile, saturating well on room air, tachycardic to 160, and with stable blood pressure.  EKG features atrial fibrillation with RVR, rate 160.  CT of the abdomen and pelvis is concerning for acute pancreatitis and interval development of pneumobilia.  Chemistry panel features a sodium of 126, potassium 2.5, AST 114, ALT 101, and total bilirubin 2.1.  CBC features a mild leukocytosis.  Initial lactic acid is elevated to 3.2.  Urinalysis notable for ketonuria and proteinuria.  Blood cultures were collected in the emergency department and the patient was treated with 4 L normal saline, morphine, Rocephin, and Flagyl.  Gastroenterology was consulted by the ED physician and recommended checking inflammatory markers, bowel rest, IV fluids, and medical admission.  Review of Systems:  All other  systems reviewed and apart from HPI, are negative.  Past Medical History:  Diagnosis Date   Allergy    Anxiety    Colitis    Ulcerative colitis ([place])     History reviewed. No pertinent surgical history.   reports that he has never smoked. He has never used smokeless tobacco. He reports current alcohol use. No history on file for drug.  Allergies  Allergen Reactions   Bee Venom Anaphylaxis    Family History  Problem Relation Age of Onset   Diabetes Mother    Hypertension Mother    Diabetes Maternal Grandfather    Hyperlipidemia Maternal Grandfather    Hypertension Maternal Grandfather      Prior to Admission medications   Medication Sig Start Date End Date Taking? Authorizing Provider  Adalimumab (HUMIRA) 40 MG/0.4ML PSKT Inject 40 mg into the skin every 14 (fourteen) days.    [provider]  Multiple Vitamin (MULTIVITAMIN) capsule Take 1 capsule by mouth daily.    [provider]  omeprazole (PRILOSEC) 40 MG capsule Take 40 mg by mouth daily.    [provider]    Physical Exam: Vitals:   04/03/19 2200 04/03/19 2215 04/03/19 2230 04/03/19 2245  BP: 121/73  (!) 110/95   Pulse: (!) 116   (!) 119  Resp: (!) 31 19 (!) 32 (!) 25  Temp:      TempSrc:      SpO2: 98%   99%  Weight:      Height:        Constitutional: NAD, calm  Eyes: PERTLA, lids and conjunctivae  normal ENMT: Mucous membranes are moist. Posterior pharynx clear of any exudate or lesions.   Neck: normal, supple, no masses, no thyromegaly Respiratory: no wheezing, no crackles. Normal respiratory effort. No accessory muscle use.  Cardiovascular: Rate ~120 and irregular. No extremity edema.  Abdomen: No distension, tender in epigastrium and RLQ, no rebound pain or guarding. Bowel sounds active.  Musculoskeletal: no clubbing / cyanosis. No joint deformity upper and lower extremities.   Skin: no significant rashes, lesions, ulcers. Warm, dry, well-perfused. Neurologic:  No gross facial asymmetry. Sensation intact. Moving all extremities.  Psychiatric: Alert and oriented to person, place, and situation. Pleasant, cooperative.     Labs on Admission: I have personally reviewed following labs and imaging studies  CBC: Recent Labs  Lab 04/03/19 1748  WBC 11.0*  NEUTROABS 9.4*  HGB 13.4  HCT 38.9*  MCV 90.5  PLT 638   Basic Metabolic Panel: Recent Labs  Lab 04/03/19 1748  NA 126*  K 2.5*  CL 82*  CO2 19*  GLUCOSE 106*  BUN <5*  CREATININE 0.62  CALCIUM 8.4*   GFR: Estimated Creatinine Clearance: 128.1 mL/min (by C-G formula based on SCr of 0.62 mg/dL). Liver Function Tests: Recent Labs  Lab 04/03/19 1748  AST 114*  ALT 101*  ALKPHOS 108  BILITOT 2.1*  PROT 6.7  ALBUMIN 2.5*   Recent Labs  Lab 04/03/19 1748  LIPASE 38   No results for input(s): AMMONIA in the last 168 hours. Coagulation Profile: No results for input(s): INR, PROTIME in the last 168 hours. Cardiac Enzymes: No results for input(s): CKTOTAL, CKMB, CKMBINDEX, TROPONINI in the last 168 hours. BNP (last 3 results) No results for input(s): PROBNP in the last 8760 hours. HbA1C: No results for input(s): HGBA1C in the last 72 hours. CBG: No results for input(s): GLUCAP in the last 168 hours. Lipid Profile: No results for input(s): CHOL, HDL, LDLCALC, TRIG, CHOLHDL, LDLDIRECT in the last 72 hours. Thyroid Function Tests: No results for input(s): TSH, T4TOTAL, FREET4, T3FREE, THYROIDAB in the last 72 hours. Anemia Panel: No results for input(s): VITAMINB12, FOLATE, FERRITIN, TIBC, IRON, RETICCTPCT in the last 72 hours. Urine analysis:    Component Value Date/Time   COLORURINE AMBER (A) 04/03/2019 1850   APPEARANCEUR CLEAR 04/03/2019 1850   LABSPEC 1.011 04/03/2019 1850   PHURINE 6.0 04/03/2019 1850   GLUCOSEU NEGATIVE 04/03/2019 1850   HGBUR NEGATIVE 04/03/2019 1850   BILIRUBINUR NEGATIVE 04/03/2019 1850   KETONESUR 80 (A) 04/03/2019 1850   PROTEINUR 30 (A)  04/03/2019 1850   NITRITE NEGATIVE 04/03/2019 1850   LEUKOCYTESUR NEGATIVE 04/03/2019 1850   Sepsis Labs: @LABRCNTIP (procalcitonin:4,lacticidven:4) )No results found for this or any previous visit (from the past 240 hour(s)).   Radiological Exams on Admission: Ct Abdomen Pelvis W Contrast  Result Date: 04/03/2019 CLINICAL DATA:  Generalized abdominal pain. History of ulcerative colitis. EXAM: CT ABDOMEN AND PELVIS WITH CONTRAST TECHNIQUE: Multidetector CT imaging of the abdomen and pelvis was performed using the standard protocol following bolus administration of intravenous contrast. CONTRAST:  152m OMNIPAQUE IOHEXOL 300 MG/ML  SOLN COMPARISON:  CT abdomen pelvis 02/11/2019 FINDINGS: Lower chest: Normal heart size. Minimal dependent atelectasis within the lung bases bilaterally. Hepatobiliary: Liver is diffusely low in attenuation compatible with steatosis. Gas within the gallbladder lumen and within the left hepatic lobe bile ducts centrally. Pancreas: The pancreas is diffusely thickened and edematous with peripancreatic fluid and fat stranding. Spleen: Unremarkable Adrenals/Urinary Tract: Normal adrenal glands. Kidneys enhance symmetrically with contrast. No hydronephrosis. Urinary bladder  is unremarkable. High attenuation within the distal left ureter is favored to represent contrast material. Stomach/Bowel: There is mild wall thickening of the cecum and ascending colon. No evidence for small bowel obstruction. No free intraperitoneal air. Normal morphology of the stomach. Vascular/Lymphatic: Normal caliber abdominal aorta. No retroperitoneal lymphadenopathy. Reproductive: Unremarkable Other: Small fat containing left inguinal hernia. Musculoskeletal: No aggressive or acute appearing osseous lesions. Lumbar spine degenerative changes. IMPRESSION: 1. Findings compatible with acute pancreatitis. 2. Interval development of pneumobilia and small amount of gas within the gallbladder lumen. Recommend  correlation for sphincterotomy. 3. Mild wall thickening of the cecum and ascending colon compatible with history of ulcerative colitis. 4. Hepatic steatosis. Electronically Signed   By: Lovey Newcomer M.D.   On: 04/03/2019 20:48   Dg Foot 2 Views Right  Result Date: 04/03/2019 CLINICAL DATA:  Pain about the dorsum of the right foot for 1 week. No known injury. EXAM: RIGHT FOOT - 2 VIEW COMPARISON:  None. FINDINGS: There is no evidence of fracture or dislocation. There is no evidence of arthropathy or other focal bone abnormality. Soft tissues are unremarkable. IMPRESSION: Negative exam. Electronically Signed   By: Inge Rise M.D.   On: 04/03/2019 17:35    EKG: Independently reviewed. Atrial fibrillation with RVR, rate 160.   Assessment/Plan   1. Acute pancreatitis  - Presents with abdominal pain and anorexia, CT abd/pelvis concerning for acute pancreatitis, transaminases in 100-range with total bili 2.1  - Patient acknowledges ongoing alcohol abuse; LFT's raise concern for biliary etiology  - He was fluid-resuscitated in ED with 4 liters of NS  - GI consulting and much appreciated  - Continue bowel-rest, IVF hydration, pain-control    2. Pneumobilia  - Presents with abdominal pain, found to have pneumobilia on CT that is also concerning for acute pancreatitis  - He denies any recent instrumentation   - GI is consulting and much appreciated  - Continue bowel rest, IVF hydration, empiric antibiotics    3. Ulcerative colitis  - Follows with GI at [facility] and managed with Humira  - He denies recent diarrhea, melena, or hematochezia and no free-air seen on CT in ED  - ?pneumobilia related to his IBD; fistulas usually related to Crohn's    4. Atrial fibrillation with RVR  - Tachycardic to 160's in ED with new atrial fibrillation on EKG  - There is no chest pain to suggest an ischemic etiology; marked electrolyte abnormalities and alcohol abuse are potential etiologies  - Replace potassium  to 4 and mag to 2, counsel regarding alcohol, check TSH, continue cardiac monitoring    5. Alcohol dependence  - Patient reports daily alcohol use and hx of withdrawal  - Monitor with CIWA, use Ativan as needed, supplement vitamins    6. Hyponatremia  - Serum sodium is 126 in ED in setting of not eating, hypovolemia, and excessive alcohol consumption  - He was given 4 liters of NS in ED and chem panel will need to be repeated now to avoid too rapid a correction  - Follow serial chem panels, adjust IVF as needed to avoid increasing by more than 8 mEq/L in 24 hrs    7. Hypokalemia  - Serum potassium is 2.5 in ED and was treated with 20 mEq IV potassium in ED   - He is in new atrial fibrillation  - Add oral potassium, empiric mag, and KCl in IVF; repeat chemistries     PPE: mask, face shield  DVT prophylaxis: SCD's  Code Status: Full  Family Communication: Wife updated at bedside Consults called: GI consulted by ED physician  Admission status: Inpatient     Vianne Bulls, MD [facility] Pager [phone]  If 7PM-7AM, please contact night-coverage [email or web address] Password [facility]  04/03/2019, 11:34 PM

## 2019-04-03 NOTE — ED Provider Notes (Signed)
[facility] EMERGENCY DEPARTMENT Provider Note   CSN: [id number] Arrival date & time: 04/03/19  1612     History   Chief Complaint Chief Complaint  Patient presents with   Abdominal Pain   Foot Pain    HPI Charles Hardy is a 43 y.o. male.     43 y.o male with a PMH of UC, Anxiety, Alcohol withdrawal presents to the ED with a chief complaint of abdominal pain x 1 week. Patient reports a generalized cramping sensation throughout his abdomen, reports he feels "something is moving ".  Patient reports he had a change in medication, reducing the medication dose into half, as he was having too many bowel movements.  He reports he has had no bowel movements in 1 week.  He is passing gas.  He is attempted to take Dulcolax 3 times a day for the past 4 days.  He feels his abdomen is distended, he has had increase in fatigue.  He has not had any fever, nausea, vomiting, sick contacts.  Of note, patient was hospitalized 4 weeks ago due to a flareup to his ulcerative colitis, reports he feels that this is somewhat different.  He has not been straining.  Second complaint includes right foot pain, reports he was ambulating when he suddenly felt his right foot strain under his right foot, states the pain to the dorsum aspect of the foot has been severe, states he is unable to fully dorsiflex his foot.  He has not tried any medication for improvement in symptoms, reports the pain is constant.  He has not had any falls or other injuries.  The history is provided by the patient and a relative.    Past Medical History:  Diagnosis Date   Allergy    Anxiety    Colitis    Ulcerative colitis [facility])     Patient Active Problem List   Diagnosis Date Noted   Acute pancreatitis 04/03/2019   Pneumobilia 04/03/2019   Hyponatremia 04/03/2019   Hypokalemia 04/03/2019   Alcohol abuse 04/03/2019   Atrial fibrillation with RVR ([place]) 04/03/2019   Iron deficiency anemia     Moderate protein-calorie malnutrition (HCC)    Alcohol withdrawal ([place]) 02/10/2019   Ulcerative colitis ([place]) 02/10/2019   Normochromic normocytic anemia 02/10/2019    History reviewed. No pertinent surgical history.      Home Medications    Prior to Admission medications   Medication Sig Start Date End Date Taking? Authorizing Provider  Adalimumab (HUMIRA) 40 MG/0.4ML PSKT Inject 40 mg into the skin every 14 (fourteen) days.    [provider]  Multiple Vitamin (MULTIVITAMIN) capsule Take 1 capsule by mouth daily.    [provider]  omeprazole (PRILOSEC) 40 MG capsule Take 40 mg by mouth daily.    [provider]    Family History Family History  Problem Relation Age of Onset   Diabetes Mother    Hypertension Mother    Diabetes Maternal Grandfather    Hyperlipidemia Maternal Grandfather    Hypertension Maternal Grandfather     Social History Social History   Tobacco Use   Smoking status: Never Smoker   Smokeless tobacco: Never Used  Substance Use Topics   Alcohol use: Yes    Comment: Vodka everyday.   Drug use: Not on file     Allergies   Bee venom   Review of Systems Review of Systems  Constitutional: Negative for chills and fever.  HENT: Negative for ear pain  and sore throat.   Eyes: Negative for pain and visual disturbance.  Respiratory: Negative for cough and shortness of breath.   Cardiovascular: Negative for chest pain and palpitations.  Gastrointestinal: Positive for abdominal pain and constipation. Negative for diarrhea, nausea and vomiting.  Genitourinary: Negative for dysuria, flank pain and hematuria.  Musculoskeletal: Positive for arthralgias. Negative for back pain.  Skin: Negative for color change and rash.  Neurological: Negative for seizures and syncope.  All other systems reviewed and are negative.    Physical Exam Updated Vital Signs BP 121/73    Pulse (!) 116    Temp 98 F (36.7 C) (Oral)     Resp (!) 31    Ht 5' 11"  (1.803 m)    Wt 85.7 kg    SpO2 98%    BMI 26.36 kg/m   Physical Exam Vitals signs and nursing note reviewed.  Constitutional:      Appearance: He is well-developed. He is ill-appearing.  HENT:     Head: Normocephalic and atraumatic.  Eyes:     General: No scleral icterus.    Pupils: Pupils are equal, round, and reactive to light.  Neck:     Musculoskeletal: Normal range of motion.  Cardiovascular:     Rate and Rhythm: Tachycardia present.     Heart sounds: Normal heart sounds.  Pulmonary:     Effort: Pulmonary effort is normal.     Breath sounds: Normal breath sounds. No wheezing.  Chest:     Chest wall: No tenderness.  Abdominal:     General: Bowel sounds are absent. There is distension.     Palpations: Abdomen is rigid.     Tenderness: There is generalized abdominal tenderness. There is no right CVA tenderness, left CVA tenderness or guarding. Negative signs include Rovsing's sign and McBurney's sign.  Musculoskeletal:        General: No tenderness or deformity.  Skin:    General: Skin is warm and dry.  Neurological:     Mental Status: He is alert and oriented to person, place, and time.      ED Treatments / Results  Labs (all labs ordered are listed, but only abnormal results are displayed) Labs Reviewed  CBC WITH DIFFERENTIAL/PLATELET - Abnormal; Notable for the following components:      Result Value   WBC 11.0 (*)    HCT 38.9 (*)    RDW 15.7 (*)    Neutro Abs 9.4 (*)    Lymphs Abs 0.5 (*)    All other components within normal limits  COMPREHENSIVE METABOLIC PANEL - Abnormal; Notable for the following components:   Sodium 126 (*)    Potassium 2.5 (*)    Chloride 82 (*)    CO2 19 (*)    Glucose, Bld 106 (*)    BUN <5 (*)    Calcium 8.4 (*)    Albumin 2.5 (*)    AST 114 (*)    ALT 101 (*)    Total Bilirubin 2.1 (*)    Anion gap 25 (*)    All other components within normal limits  URINALYSIS, ROUTINE W REFLEX MICROSCOPIC -  Abnormal; Notable for the following components:   Color, Urine AMBER (*)    Ketones, ur 80 (*)    Protein, ur 30 (*)    Bacteria, UA RARE (*)    All other components within normal limits  LACTIC ACID, PLASMA - Abnormal; Notable for the following components:   Lactic Acid, Venous 3.2 (*)  All other components within normal limits  CULTURE, BLOOD (ROUTINE X 2)  CULTURE, BLOOD (ROUTINE X 2)  SARS CORONAVIRUS 2 (TAT 6-24 HRS)  LIPASE, BLOOD  LACTIC ACID, PLASMA  C-REACTIVE PROTEIN  SEDIMENTATION RATE  BASIC METABOLIC PANEL  BASIC METABOLIC PANEL  BASIC METABOLIC PANEL  HEPATIC FUNCTION PANEL  MAGNESIUM    EKG EKG Interpretation  Date/Time:  Saturday April 03 2019 21:08:23 EST Ventricular Rate:  160 PR Interval:    QRS Duration: 87 QT Interval:  268 QTC Calculation: 438 R Axis:   86 Text Interpretation: Atrial fibrillation Paired ventricular premature complexes Repol abnrm suggests ischemia, diffuse leads Baseline wander in lead(s) V1 agree, a fib new compared to previos Confirmed by Charlesetta Shanks [phone]) on 04/03/2019 9:34:48 PM   Radiology Ct Abdomen Pelvis W Contrast  Result Date: 04/03/2019 CLINICAL DATA:  Generalized abdominal pain. History of ulcerative colitis. EXAM: CT ABDOMEN AND PELVIS WITH CONTRAST TECHNIQUE: Multidetector CT imaging of the abdomen and pelvis was performed using the standard protocol following bolus administration of intravenous contrast. CONTRAST:  163m OMNIPAQUE IOHEXOL 300 MG/ML  SOLN COMPARISON:  CT abdomen pelvis 02/11/2019 FINDINGS: Lower chest: Normal heart size. Minimal dependent atelectasis within the lung bases bilaterally. Hepatobiliary: Liver is diffusely low in attenuation compatible with steatosis. Gas within the gallbladder lumen and within the left hepatic lobe bile ducts centrally. Pancreas: The pancreas is diffusely thickened and edematous with peripancreatic fluid and fat stranding. Spleen: Unremarkable Adrenals/Urinary Tract:  Normal adrenal glands. Kidneys enhance symmetrically with contrast. No hydronephrosis. Urinary bladder is unremarkable. High attenuation within the distal left ureter is favored to represent contrast material. Stomach/Bowel: There is mild wall thickening of the cecum and ascending colon. No evidence for small bowel obstruction. No free intraperitoneal air. Normal morphology of the stomach. Vascular/Lymphatic: Normal caliber abdominal aorta. No retroperitoneal lymphadenopathy. Reproductive: Unremarkable Other: Small fat containing left inguinal hernia. Musculoskeletal: No aggressive or acute appearing osseous lesions. Lumbar spine degenerative changes. IMPRESSION: 1. Findings compatible with acute pancreatitis. 2. Interval development of pneumobilia and small amount of gas within the gallbladder lumen. Recommend correlation for sphincterotomy. 3. Mild wall thickening of the cecum and ascending colon compatible with history of ulcerative colitis. 4. Hepatic steatosis. Electronically Signed   By: DLovey NewcomerM.D.   On: 04/03/2019 20:48   Dg Foot 2 Views Right  Result Date: 04/03/2019 CLINICAL DATA:  Pain about the dorsum of the right foot for 1 week. No known injury. EXAM: RIGHT FOOT - 2 VIEW COMPARISON:  None. FINDINGS: There is no evidence of fracture or dislocation. There is no evidence of arthropathy or other focal bone abnormality. Soft tissues are unremarkable. IMPRESSION: Negative exam. Electronically Signed   By: TInge RiseM.D.   On: 04/03/2019 17:35    Procedures .Critical Care Performed by: SJaneece Fitting PA-C Authorized by: SJaneece Fitting PA-C   Critical care provider statement:    Critical care time (minutes):  35   Critical care start time:  04/03/2019 5:00 PM   Critical care end time:  04/03/2019 5:35 PM   Critical care time was exclusive of:  Separately billable procedures and treating other patients   Critical care was necessary to treat or prevent imminent or life-threatening  deterioration of the following conditions:  Sepsis   Critical care was time spent personally by me on the following activities:  Blood draw for specimens, development of treatment plan with patient or surrogate, discussions with consultants, evaluation of patient's response to treatment, examination of patient, obtaining history  from patient or surrogate, ordering and performing treatments and interventions, ordering and review of laboratory studies, ordering and review of radiographic studies, pulse oximetry, re-evaluation of patient's condition and review of old charts   (including critical care time)  Medications Ordered in ED Medications  potassium chloride 10 mEq in 100 mL IVPB (10 mEq Intravenous New Bag/Given 04/03/19 2154)  sodium chloride 0.9 % bolus 1,000 mL (has no administration in time range)  potassium chloride SA (KLOR-CON) CR tablet 40 mEq (has no administration in time range)  magnesium sulfate IVPB 2 g 50 mL (has no administration in time range)  0.9 % NaCl with KCl 20 mEq/ L  infusion (has no administration in time range)  sodium chloride 0.9 % bolus 1,000 mL (0 mLs Intravenous Stopped 04/03/19 1846)  potassium chloride SA (KLOR-CON) CR tablet 40 mEq (40 mEq Oral Given 04/03/19 2039)  sodium chloride 0.9 % bolus 1,000 mL (0 mLs Intravenous Stopped 04/03/19 2150)  morphine 4 MG/ML injection 4 mg (4 mg Intravenous Given 04/03/19 2042)  iohexol (OMNIPAQUE) 300 MG/ML solution 100 mL (100 mLs Intravenous Contrast Given 04/03/19 2022)  ondansetron (ZOFRAN) 4 MG/2ML injection (4 mg  Given 04/03/19 2048)  LORazepam (ATIVAN) injection 2 mg (2 mg Intravenous Given 04/03/19 2109)  cefTRIAXone (ROCEPHIN) 2 g in sodium chloride 0.9 % 100 mL IVPB (0 g Intravenous Stopped 04/03/19 2155)  metroNIDAZOLE (FLAGYL) IVPB 500 mg (0 mg Intravenous Stopped 04/03/19 2221)  sodium chloride 0.9 % bolus 1,000 mL (1,000 mLs Intravenous New Bag/Given 04/03/19 2154)     Initial Impression / Assessment  and Plan / ED Course  I have reviewed the triage vital signs and the nursing notes.  Pertinent labs & imaging results that were available during my care of the patient were reviewed by me and considered in my medical decision making (see chart for details).      With a past medical history of ulcerative colitis presents to the ED with complaints of constipation, abdominal pain for the past week.  Reports his last bowel movement was a week ago, he is currently not passing any gas, has tried some Dulcolax for the past 4 days without improvement in symptoms.  During my evaluation abdomen appears distended, bowel sounds are absent, there is tenderness to palpation throughout the whole abdomen.  He appears uncomfortable, heart rate is remarkable for tachycardia with a heart rate of 1 20-1 30 at rest.  He reports he has not been running any fevers.  Torrential diagnosis included but not limited to instruction versus constipation versus ileus.  CBC with a slight leukocytosis at 11.0, BMP remarkable for hypokalemia, will replace this IV and orally.  Glucose is slightly elevated.  AST and LFTs are elevated, he does have a prior history of alcohol use.  UA shows large amount of ketones, protein, rare bacteria patient likely dehydrated.  Will continue to replace with bolus.  CT Abdomen results showed: 1. Findings compatible with acute pancreatitis.  2. Interval development of pneumobilia and small amount of gas  within the gallbladder lumen. Recommend correlation for  sphincterotomy.  3. Mild wall thickening of the cecum and ascending colon compatible  with history of ulcerative colitis.  4. Hepatic steatosis.      9:05 PM I was called to the room as patient returned from CT with a heart rate in the 160s to 200s.  His rhythm has changed from normal sinus rhythm to A. fib, no prior history of A. fib.  He has been given  a liter of fluids along with potassium replacement, will have 1/3 L along with obtain  EKG.  I had Dr. Vallery Ridge evaluated patient, recommended gastro along with general surgery consult.  Will place page out to gastroenterology.  Patient has been given 2 mg of Ativan to help with withdrawal symptoms.  9:48 PM spoke to  Dr. Collene Mares gastroenterology who recommended ESRD, CRP, bowel rest, hydration.  Also recommend running the case by general surgery.   Spoke to Dr. Georgette Dover who reports this does not fit the sure for a surgical case at this time.  Does recommend HIDA scan, if this is positive patient will likely need surgical consultation.  After 2 L of fluid, patient has gone back into normal sinus rhythm.  Will obtain EKG for confirmation.  EKG has been NSR. Patient to be admitted.    10:25 PM Spoke to Dr. Myna Hidalgo who will admit patient for further management.    Portions of this note were generated with Lobbyist. Dictation errors may occur despite best attempts at proofreading.  Final Clinical Impressions(s) / ED Diagnoses   Final diagnoses:  Generalized abdominal pain  Acute pancreatitis, unspecified complication status, unspecified pancreatitis type  Hypokalemia  PAF (paroxysmal atrial fibrillation) [facility])    ED Discharge Orders    None       Janeece Fitting, Hershal Coria 04/03/19 2233    Daleen Bo, MD 04/05/19 1134

## 2019-04-03 NOTE — ED Triage Notes (Signed)
THE PT HAS 2 COMPLAINTS ABD PAIN AND RT FOOT PAIN FOR OVER ONE WEEK  HE THINKS HE INJURED HIS RT FOOT  HE HAS NOT HAD A BM FOR ONE WEEK  NO NAUSEA

## 2019-04-03 NOTE — ED Notes (Signed)
Pt to xray

## 2019-04-03 NOTE — ED Provider Notes (Signed)
Medical screening examination/treatment/procedure(s) were conducted as a shared visit with non-physician practitioner(s) and myself.  I personally evaluated the patient during the encounter.    Patient has history of ulcerative colitis.  He has had abdominal pain for about the past week.  Is been cramping.  He reports that he has not had a bowel movement in about a week.  He is passing gas.  He has been trying Dulcolax but his abdomen just feels more distended.  Patient is alert.  Ill in appearance.  Tachycardic.  No respiratory distress.  Abdomen is diffusely tender.  Patient had fluctuation in heart rate was transition from normal sinus to A. fib.  He had no prior history of atrial fibrillation.  My plan for initial management is fluid resuscitation with replacement of potassium.  Patient also given Ativan to help with potential alcohol withdrawal.  Patient's mental status and respiratory status remain appropriate.  No respiratory distress.  I agree with plan of management.   Charlesetta Shanks, MD 04/19/19 1246

## 2019-04-04 ENCOUNTER — Inpatient Hospital Stay (HOSPITAL_COMMUNITY): Payer: BC Managed Care – PPO

## 2019-04-04 ENCOUNTER — Other Ambulatory Visit: Payer: Self-pay

## 2019-04-04 ENCOUNTER — Encounter (HOSPITAL_COMMUNITY): Payer: Self-pay

## 2019-04-04 DIAGNOSIS — R1084 Generalized abdominal pain: Secondary | ICD-10-CM

## 2019-04-04 LAB — HEPATIC FUNCTION PANEL
ALT: 87 U/L — ABNORMAL HIGH (ref 0–44)
AST: 92 U/L — ABNORMAL HIGH (ref 15–41)
Albumin: 2.2 g/dL — ABNORMAL LOW (ref 3.5–5.0)
Alkaline Phosphatase: 85 U/L (ref 38–126)
Bilirubin, Direct: 0.7 mg/dL — ABNORMAL HIGH (ref 0.0–0.2)
Indirect Bilirubin: 1.2 mg/dL — ABNORMAL HIGH (ref 0.3–0.9)
Total Bilirubin: 1.9 mg/dL — ABNORMAL HIGH (ref 0.3–1.2)
Total Protein: 5.8 g/dL — ABNORMAL LOW (ref 6.5–8.1)

## 2019-04-04 LAB — BASIC METABOLIC PANEL
Anion gap: 13 (ref 5–15)
Anion gap: 11 (ref 5–15)
BUN: <5 mg/dL — ABNORMAL LOW (ref 6–20)
BUN: <5 mg/dL — ABNORMAL LOW (ref 6–20)
CO2: 23 mmol/L (ref 22–32)
CO2: 24 mmol/L (ref 22–32)
Calcium: 7.4 mg/dL — ABNORMAL LOW (ref 8.9–10.3)
Calcium: 7.7 mg/dL — ABNORMAL LOW (ref 8.9–10.3)
Chloride: 94 mmol/L — ABNORMAL LOW (ref 98–111)
Chloride: 93 mmol/L — ABNORMAL LOW (ref 98–111)
Creatinine, Ser: 0.66 mg/dL (ref 0.61–1.24)
Creatinine, Ser: 0.57 mg/dL — ABNORMAL LOW (ref 0.61–1.24)
GFR calc Af Amer: >60 mL/min (ref >60)
GFR calc Af Amer: >60 mL/min (ref >60)
GFR calc non Af Amer: >60 mL/min (ref >60)
GFR calc non Af Amer: >60 mL/min (ref >60)
Glucose, Bld: 120 mg/dL — ABNORMAL HIGH (ref 70–99)
Glucose, Bld: 135 mg/dL — ABNORMAL HIGH (ref 70–99)
Potassium: 3.3 mmol/L — ABNORMAL LOW (ref 3.5–5.1)
Potassium: 3.3 mmol/L — ABNORMAL LOW (ref 3.5–5.1)
Sodium: 130 mmol/L — ABNORMAL LOW (ref 135–145)
Sodium: 128 mmol/L — ABNORMAL LOW (ref 135–145)

## 2019-04-04 LAB — PHOSPHORUS: Phosphorus: 2.3 mg/dL — ABNORMAL LOW (ref 2.5–4.6)

## 2019-04-04 LAB — C-REACTIVE PROTEIN: CRP: 25.7 mg/dL — ABNORMAL HIGH (ref <1.0)

## 2019-04-04 LAB — SARS CORONAVIRUS 2 (TAT 6-24 HRS): SARS Coronavirus 2: NEGATIVE

## 2019-04-04 LAB — GLUCOSE, CAPILLARY: Glucose-Capillary: 135 mg/dL — ABNORMAL HIGH (ref 70–99)

## 2019-04-04 LAB — SEDIMENTATION RATE: Sed Rate: 25 mm/hr — ABNORMAL HIGH (ref 0–16)

## 2019-04-04 LAB — MAGNESIUM: Magnesium: 1.9 mg/dL (ref 1.7–2.4)

## 2019-04-04 LAB — TSH: TSH: 0.736 u[IU]/mL (ref 0.350–4.500)

## 2019-04-04 MED ORDER — ACETAMINOPHEN 325 MG PO TABS: 650.0000 mg | ORAL_TABLET | Freq: Four times a day (QID) | ORAL | Status: DC | PRN

## 2019-04-04 MED ORDER — ACETAMINOPHEN 650 MG RE SUPP: 650.0000 mg | Freq: Four times a day (QID) | RECTAL | Status: DC | PRN

## 2019-04-04 MED ORDER — SODIUM CHLORIDE 0.9% FLUSH
3.0000 mL | Freq: Two times a day (BID) | INTRAVENOUS | Status: DC
  Administered 2019-04-04 – 2019-04-06 (×5): 3 mL via INTRAVENOUS

## 2019-04-04 MED ORDER — FENTANYL CITRATE (PF) 100 MCG/2ML IJ SOLN
25.0000 ug | INTRAMUSCULAR | Status: DC | PRN
  Administered 2019-04-04 – 2019-04-05 (×3): 25 ug via INTRAVENOUS
  Filled 2019-04-04 (×3): qty 2

## 2019-04-04 MED ORDER — POTASSIUM CHLORIDE IN NACL 20-0.9 MEQ/L-% IV SOLN
INTRAVENOUS | Status: DC
  Administered 2019-04-04 (×2): via INTRAVENOUS
  Filled 2019-04-04 (×2): qty 1000

## 2019-04-04 MED ORDER — FAMOTIDINE IN NACL 20-0.9 MG/50ML-% IV SOLN
20.0000 mg | Freq: Two times a day (BID) | INTRAVENOUS | Status: DC
  Administered 2019-04-04 – 2019-04-05 (×4): 20 mg via INTRAVENOUS
  Filled 2019-04-04 (×5): qty 50

## 2019-04-04 MED ORDER — ENOXAPARIN SODIUM 40 MG/0.4ML ~~LOC~~ SOLN
40.0000 mg | SUBCUTANEOUS | Status: DC
  Administered 2019-04-04 – 2019-04-07 (×4): 40 mg via SUBCUTANEOUS
  Filled 2019-04-04 (×4): qty 0.4

## 2019-04-04 NOTE — ED Notes (Signed)
ED TO INPATIENT HANDOFF REPORT  ED Nurse Name and Phone #: Almyra Free [phone]  S Name/Age/Gender Charles Hardy 43 y.o. male Room/Bed: 008C/008C  Code Status   Code Status: Prior  Home/SNF/Other Home Patient oriented to: self, place, time and situation Is this baseline? Yes   Triage Complete: Triage complete  Chief Complaint Abd Pain, Foot Pain  Triage Note THE PT HAS 2 COMPLAINTS ABD PAIN AND RT FOOT PAIN FOR OVER ONE WEEK  HE THINKS HE INJURED HIS RT FOOT  HE HAS NOT HAD A BM FOR ONE WEEK  NO NAUSEA   Allergies Allergies  Allergen Reactions  . Bee Venom Anaphylaxis    Level of Care/Admitting Diagnosis ED Disposition    ED Disposition Condition [facility]: [place] [[id number]]  Level of Care: Progressive [102]  Admit to Progressive based on following criteria: CARDIOVASCULAR & THORACIC of moderate stability with acute coronary syndrome symptoms/low risk myocardial infarction/hypertensive urgency/arrhythmias/heart failure potentially compromising stability and stable post cardiovascular intervention patients.  Covid Evaluation: Asymptomatic Screening Protocol (No Symptoms)  Diagnosis: Acute pancreatitis [577.0.ICD-9-CM]  Admitting Physician: Vianne Bulls [[id number]]  Attending Physician: Vianne Bulls [[id number]]  Estimated length of stay: past midnight tomorrow  Certification:: I certify this patient will need inpatient services for at least 2 midnights  PT Class (Do Not Modify): Inpatient [101]  PT Acc Code (Do Not Modify): Private [1]       B Medical/Surgery History Past Medical History:  Diagnosis Date  . Allergy   . Anxiety   . Colitis   . Ulcerative colitis ([place])    History reviewed. No pertinent surgical history.   A IV Location/Drains/Wounds Patient Lines/Drains/Airways Status   Active Line/Drains/Airways    Name:   Placement date:   Placement time:   Site:   Days:   Peripheral IV 04/03/19 Right Antecubital    04/03/19    1746    Antecubital   1   Peripheral IV 04/03/19 Left Antecubital   04/03/19    2017    Antecubital   1          Intake/Output Last 24 hours  Intake/Output Summary (Last 24 hours) at 04/04/2019 0003 Last data filed at 04/03/2019 2248 Gross per 24 hour  Intake 3000 ml  Output 100 ml  Net 2900 ml    Labs/Imaging Results for orders placed or performed during the hospital encounter of 04/03/19 (from the past 48 hour(s))  CBC with Differential     Status: Abnormal   Collection Time: 04/03/19  5:48 PM  Result Value Ref Range   WBC 11.0 (H) 4.0 - 10.5 K/uL   RBC 4.30 4.22 - 5.81 MIL/uL   Hemoglobin 13.4 13.0 - 17.0 g/dL   HCT 38.9 (L) 39.0 - 52.0 %   MCV 90.5 80.0 - 100.0 fL   MCH 31.2 26.0 - 34.0 pg   MCHC 34.4 30.0 - 36.0 g/dL   RDW 15.7 (H) 11.5 - 15.5 %   Platelets 159 150 - 400 K/uL   nRBC 0.0 0.0 - 0.2 %   Neutrophils Relative % 85 %   Neutro Abs 9.4 (H) 1.7 - 7.7 K/uL   Lymphocytes Relative 5 %   Lymphs Abs 0.5 (L) 0.7 - 4.0 K/uL   Monocytes Relative 9 %   Monocytes Absolute 1.0 0.1 - 1.0 K/uL   Eosinophils Relative 0 %   Eosinophils Absolute 0.0 0.0 - 0.5 K/uL   Basophils Relative 0 %  Basophils Absolute 0.0 0.0 - 0.1 K/uL   Immature Granulocytes 1 %   Abs Immature Granulocytes 0.05 0.00 - 0.07 K/uL    Comment: Performed at [facility], [place] [address], [place], [place] [address]  Comprehensive metabolic panel     Status: Abnormal   Collection Time: 04/03/19  5:48 PM  Result Value Ref Range   Sodium 126 (L) 135 - 145 mmol/L   Potassium 2.5 (LL) 3.5 - 5.1 mmol/L    Comment: CRITICAL RESULT CALLED TO, READ BACK BY AND VERIFIED WITH: K.BROWN,RN @ 1916 04/03/2019 WEBBERJ    Chloride 82 (L) 98 - 111 mmol/L   CO2 19 (L) 22 - 32 mmol/L   Glucose, Bld 106 (H) 70 - 99 mg/dL   BUN <5 (L) 6 - 20 mg/dL   Creatinine, Ser 0.62 0.61 - 1.24 mg/dL   Calcium 8.4 (L) 8.9 - 10.3 mg/dL   Total Protein 6.7 6.5 - 8.1 g/dL   Albumin 2.5 (L) 3.5 - 5.0 g/dL    AST 114 (H) 15 - 41 U/L   ALT 101 (H) 0 - 44 U/L   Alkaline Phosphatase 108 38 - 126 U/L   Total Bilirubin 2.1 (H) 0.3 - 1.2 mg/dL   GFR calc non Af Amer >60 >60 mL/min   GFR calc Af Amer >60 >60 mL/min   Anion gap 25 (H) 5 - 15    Comment: Performed at [facility], [place] [address] [place], [place] [address]  Lipase, blood     Status: None   Collection Time: 04/03/19  5:48 PM  Result Value Ref Range   Lipase 38 11 - 51 U/L    Comment: Performed at [facility] [address] [place], [place] [address]  Lactic acid, plasma     Status: Abnormal   Collection Time: 04/03/19  5:48 PM  Result Value Ref Range   Lactic Acid, Venous 3.2 (HH) 0.5 - 1.9 mmol/L    Comment: CRITICAL RESULT CALLED TO, READ BACK BY AND VERIFIED WITH: K.BROWN,RN @ 1908 04/03/2019 [place] Performed at [facility], [place] [address], [place], [place] [address]   Urinalysis, Routine w reflex microscopic     Status: Abnormal   Collection Time: 04/03/19  6:50 PM  Result Value Ref Range   Color, Urine AMBER (A) YELLOW    Comment: BIOCHEMICALS MAY BE AFFECTED BY COLOR   APPearance CLEAR CLEAR   Specific Gravity, Urine 1.011 1.005 - 1.030   pH 6.0 5.0 - 8.0   Glucose, UA NEGATIVE NEGATIVE mg/dL   Hgb urine dipstick NEGATIVE NEGATIVE   Bilirubin Urine NEGATIVE NEGATIVE   Ketones, ur 80 (A) NEGATIVE mg/dL   Protein, ur 30 (A) NEGATIVE mg/dL   Nitrite NEGATIVE NEGATIVE   Leukocytes,Ua NEGATIVE NEGATIVE   RBC / HPF 0-5 0 - 5 RBC/hpf   WBC, UA 0-5 0 - 5 WBC/hpf   Bacteria, UA RARE (A) NONE SEEN   Mucus PRESENT     Comment: Performed at [facility], [place] [address], [place], [place] [address]  Lactic acid, plasma     Status: None   Collection Time: 04/03/19  8:15 PM  Result Value Ref Range   Lactic Acid, Venous 1.6 0.5 - 1.9 mmol/L    Comment: Performed at [facility] [address] [place], [place] [address]  Basic metabolic panel     Status: Abnormal   Collection Time: 04/03/19  10:53 PM  Result Value  Ref Range   Sodium 130 (L) 135 - 145 mmol/L   Potassium 2.9 (L) 3.5 - 5.1 mmol/L   Chloride 89 (L) 98 - 111 mmol/L   CO2 22 22 - 32 mmol/L   Glucose, Bld 107 (H) 70 - 99 mg/dL   BUN <5 (L) 6 - 20 mg/dL   Creatinine, Ser 0.82 0.61 - 1.24 mg/dL   Calcium 7.4 (L) 8.9 - 10.3 mg/dL   GFR calc non Af Amer >60 >60 mL/min   GFR calc Af Amer >60 >60 mL/min   Anion gap 19 (H) 5 - 15    Comment: Performed at [facility] [address] [place], [place] [address]   Ct Abdomen Pelvis W Contrast  Result Date: 04/03/2019 CLINICAL DATA:  Generalized abdominal pain. History of ulcerative colitis. EXAM: CT ABDOMEN AND PELVIS WITH CONTRAST TECHNIQUE: Multidetector CT imaging of the abdomen and pelvis was performed using the standard protocol following bolus administration of intravenous contrast. CONTRAST:  175m OMNIPAQUE IOHEXOL 300 MG/ML  SOLN COMPARISON:  CT abdomen pelvis 02/11/2019 FINDINGS: Lower chest: Normal heart size. Minimal dependent atelectasis within the lung bases bilaterally. Hepatobiliary: Liver is diffusely low in attenuation compatible with steatosis. Gas within the gallbladder lumen and within the left hepatic lobe bile ducts centrally. Pancreas: The pancreas is diffusely thickened and edematous with peripancreatic fluid and fat stranding. Spleen: Unremarkable Adrenals/Urinary Tract: Normal adrenal glands. Kidneys enhance symmetrically with contrast. No hydronephrosis. Urinary bladder is unremarkable. High attenuation within the distal left ureter is favored to represent contrast material. Stomach/Bowel: There is mild wall thickening of the cecum and ascending colon. No evidence for small bowel obstruction. No free intraperitoneal air. Normal morphology of the stomach. Vascular/Lymphatic: Normal caliber abdominal aorta. No retroperitoneal lymphadenopathy. Reproductive: Unremarkable Other: Small fat containing left inguinal hernia. Musculoskeletal: No aggressive or  acute appearing osseous lesions. Lumbar spine degenerative changes. IMPRESSION: 1. Findings compatible with acute pancreatitis. 2. Interval development of pneumobilia and small amount of gas within the gallbladder lumen. Recommend correlation for sphincterotomy. 3. Mild wall thickening of the cecum and ascending colon compatible with history of ulcerative colitis. 4. Hepatic steatosis. Electronically Signed   By: DLovey NewcomerM.D.   On: 04/03/2019 20:48   Dg Foot 2 Views Right  Result Date: 04/03/2019 CLINICAL DATA:  Pain about the dorsum of the right foot for 1 week. No known injury. EXAM: RIGHT FOOT - 2 VIEW COMPARISON:  None. FINDINGS: There is no evidence of fracture or dislocation. There is no evidence of arthropathy or other focal bone abnormality. Soft tissues are unremarkable. IMPRESSION: Negative exam. Electronically Signed   By: TInge RiseM.D.   On: 04/03/2019 17:35    Pending Labs Unresulted Labs (From admission, onward)    Start     Ordered   04/04/19 0500  Hepatic function panel  Tomorrow morning,   R     04/03/19 2231   04/04/19 0500  Magnesium  Tomorrow morning,   R     04/03/19 2231   04/04/19 0500  Phosphorus  Tomorrow morning,   R     04/03/19 2233   04/04/19 0500  TSH  Tomorrow morning,   R     04/03/19 2336   04/03/19 27544 Basic metabolic panel  Now then every 6 hours,   R (with STAT occurrences)     04/03/19 2231   04/03/19 2134  C-reactive protein  Once,   STAT     04/03/19 2134   04/03/19 2134  Sedimentation rate  ONCE - STAT,   STAT     04/03/19 2134   04/03/19 2059  SARS CORONAVIRUS 2 (TAT 6-24 HRS) Nasopharyngeal Nasopharyngeal Swab  (Asymptomatic/Tier 3)  Once,   STAT    Question Answer Comment  Is this test for diagnosis or screening Screening   Symptomatic for COVID-19 as defined by CDC No   Hospitalized for COVID-19 No   Admitted to ICU for COVID-19 No   Previously tested for COVID-19 Yes   Resident in a congregate (group) care setting No    Employed in healthcare setting No      04/03/19 2058   04/03/19 1947  Blood culture (routine x 2)  BLOOD CULTURE X 2,   STAT     04/03/19 1946          Vitals/Pain Today's Vitals   04/03/19 2245 04/03/19 2330 04/03/19 2334 04/03/19 2335  BP:  122/86 122/86   Pulse: (!) 119 (!) 115 (!) 124   Resp: (!) 25 (!) 24    Temp:      TempSrc:      SpO2: 99% 99%    Weight:      Height:      PainSc:    3     Isolation Precautions No active isolations  Medications Medications  0.9 % NaCl with KCl 20 mEq/ L  infusion ( Intravenous New Bag/Given 04/04/19 0001)  fentaNYL (SUBLIMAZE) injection 25-50 mcg (25 mcg Intravenous Given 04/03/19 2350)  ondansetron (ZOFRAN) injection 4 mg (has no administration in time range)  LORazepam (ATIVAN) tablet 1-4 mg (has no administration in time range)    Or  LORazepam (ATIVAN) injection 1-4 mg (has no administration in time range)  thiamine (VITAMIN B-1) tablet 100 mg (has no administration in time range)    Or  thiamine (B-1) injection 100 mg (has no administration in time range)  folic acid (FOLVITE) tablet 1 mg (has no administration in time range)  multivitamin with minerals tablet 1 tablet (has no administration in time range)  LORazepam (ATIVAN) injection 0-4 mg (1 mg Intravenous Given 04/03/19 2351)    Followed by  LORazepam (ATIVAN) injection 0-4 mg (has no administration in time range)  cefTRIAXone (ROCEPHIN) 2 g in sodium chloride 0.9 % 100 mL IVPB (has no administration in time range)  metroNIDAZOLE (FLAGYL) IVPB 500 mg (has no administration in time range)  sodium chloride 0.9 % bolus 1,000 mL (0 mLs Intravenous Stopped 04/03/19 1846)  potassium chloride 10 mEq in 100 mL IVPB (0 mEq Intravenous Stopped 04/03/19 2250)  potassium chloride SA (KLOR-CON) CR tablet 40 mEq (40 mEq Oral Given 04/03/19 2039)  sodium chloride 0.9 % bolus 1,000 mL (0 mLs Intravenous Stopped 04/03/19 2150)  morphine 4 MG/ML injection 4 mg (4 mg Intravenous Given  04/03/19 2042)  iohexol (OMNIPAQUE) 300 MG/ML solution 100 mL (100 mLs Intravenous Contrast Given 04/03/19 2022)  ondansetron (ZOFRAN) 4 MG/2ML injection (4 mg  Given 04/03/19 2048)  LORazepam (ATIVAN) injection 2 mg (2 mg Intravenous Given 04/03/19 2109)  cefTRIAXone (ROCEPHIN) 2 g in sodium chloride 0.9 % 100 mL IVPB (0 g Intravenous Stopped 04/03/19 2155)  metroNIDAZOLE (FLAGYL) IVPB 500 mg (0 mg Intravenous Stopped 04/03/19 2221)  sodium chloride 0.9 % bolus 1,000 mL (0 mLs Intravenous Stopped 04/03/19 2248)  sodium chloride 0.9 % bolus 1,000 mL (0 mLs Intravenous Stopped 04/03/19 2350)  potassium chloride SA (KLOR-CON) CR tablet 40 mEq (40 mEq Oral Given 04/03/19 2254)  magnesium sulfate IVPB 2 g  50 mL (0 g Intravenous Stopped 04/04/19 0000)  diltiazem (CARDIZEM) injection 15 mg (15 mg Intravenous Given 04/03/19 2355)    Mobility walks Low fall risk   Focused Assessments Cardiac Assessment Handoff:    No results found for: CKTOTAL, CKMB, CKMBINDEX, TROPONINI No results found for: DDIMER Does the Patient currently have chest pain? No      R Recommendations: See Admitting Provider Note  Report given to:   Additional Notes:

## 2019-04-04 NOTE — Progress Notes (Addendum)
PROGRESS NOTE    Charles Hardy  IRW:[id number] DOB: 1976-02-27 DOA: 04/03/2019 PCP: Patient, No Pcp Per  Brief Narrative: Charles Hardy is a 43 y.o. male with medical history significant for ulcerative colitis and alcohol dependence, presented to the emergency department for evaluation of abdominal pain.  Patient reports approximately 1 week of progressive abdominal pain with loss of appetite. -He recently established care with gastroenterology at [facility] for ulcerative colitis and was treated with Humira, last dose was about 10 days ago -In the emergency room he was noted to be afebrile tachycardic, he had a transient episode of A. fib RVR, subsequently converted to sinus tachycardia, CT abdomen pelvis was concerning for acute pancreatitis with pneumobilia, also noted to have mildly elevated AST ALT and a bili of 2.1 -Gastroenterology was consulted overnight  Assessment & Plan:  Acute pancreatitis, pneumobilia -Clinically suspect alcoholic pancreatitis, long-term history of alcohol abuse, last 2 weeks was drinking heavily 12 pack a day -Continue bowel rest, n.p.o. except ice chips -IV fluids normal saline with potassium at 100 cc an hour -Given concern for pneumobilia on CT abdomen pelvis we will check right upper quadrant ultrasound to rule out gallstones, cholecystitis -Remains on empiric ceftriaxone and Flagyl -Gastroenterology was consulted overnight, await input  Transient A. fib RVR -Resolved, now with sinus tachycardia likely in the setting of acute catecholamine surge -Monitor on telemetry  Alcohol dependence -Heavy abuse recently, at high risk of withdrawal, went into the EtOH withdrawal during his recent admission -Continue thiamine, monitor with CIWA protocol  Hyponatremia -Improving with hydration  Hypokalemia -Continue replacement, improving  Ulcerative colitis -Followed by gastroenterology at [place] approximately 10 days ago  DVT prophylaxis:  Add Lovenox Code Status: Full code Family Communication: No family at bedside Disposition Plan: Home pending clinical improvement  Consultants:   Gastroenterology   Procedures:   Antimicrobials:    Subjective: -Feels better, abdominal pain is improving, no nausea vomiting overnight  Objective: Vitals:   04/04/19 0318 04/04/19 0456 04/04/19 0600 04/04/19 0844  BP:  (!) 132/96  (!) 148/100  Pulse: (!) 117 (!) 114  (!) 111  Resp:  20 (!) 29 (!) 31  Temp:  97.8 F (36.6 C)  98.7 F (37.1 C)  TempSrc:  Oral  Axillary  SpO2:  99%  97%  Weight:      Height:        Intake/Output Summary (Last 24 hours) at 04/04/2019 1056 Last data filed at 04/04/2019 0700 Gross per 24 hour  Intake 3655.15 ml  Output 500 ml  Net 3155.15 ml   Filed Weights   04/03/19 1630 04/04/19 0101  Weight: 85.7 kg 89.7 kg    Examination:  General exam: AAOx3, no distress Respiratory system: Decreased breath sounds bases Cardiovascular system: S1 & S2 heard, tachycardic  gastrointestinal system: Mildly distended, nontender, bowel sounds diminished Central nervous system: Alert and oriented. No focal neurological deficits. Extremities: No edema Skin: No rashes, lesions or ulcers Psychiatry: Judgement and insight appear normal, no tremors today    Data Reviewed:   CBC: Recent Labs  Lab 04/03/19 1748  WBC 11.0*  NEUTROABS 9.4*  HGB 13.4  HCT 38.9*  MCV 90.5  PLT 761   Basic Metabolic Panel: Recent Labs  Lab 04/03/19 1748 04/03/19 2253 04/04/19 0302  NA 126* 130* 130*  K 2.5* 2.9* 3.3*  CL 82* 89* 94*  CO2 19* 22 23  GLUCOSE 106* 107* 120*  BUN <5* <5* <5*  CREATININE 0.62 0.82 0.66  CALCIUM 8.4* 7.4* 7.4*  MG  --   --  1.9  PHOS  --   --  2.3*   GFR: Estimated Creatinine Clearance: 128.1 mL/min (by C-G formula based on SCr of 0.66 mg/dL). Liver Function Tests: Recent Labs  Lab 04/03/19 1748 04/04/19 0302  AST 114* 92*  ALT 101* 87*  ALKPHOS 108 85  BILITOT 2.1*  1.9*  PROT 6.7 5.8*  ALBUMIN 2.5* 2.2*   Recent Labs  Lab 04/03/19 1748  LIPASE 38   No results for input(s): AMMONIA in the last 168 hours. Coagulation Profile: No results for input(s): INR, PROTIME in the last 168 hours. Cardiac Enzymes: No results for input(s): CKTOTAL, CKMB, CKMBINDEX, TROPONINI in the last 168 hours. BNP (last 3 results) No results for input(s): PROBNP in the last 8760 hours. HbA1C: No results for input(s): HGBA1C in the last 72 hours. CBG: Recent Labs  Lab 04/04/19 0611  GLUCAP 135*   Lipid Profile: No results for input(s): CHOL, HDL, LDLCALC, TRIG, CHOLHDL, LDLDIRECT in the last 72 hours. Thyroid Function Tests: Recent Labs    04/04/19 0302  TSH 0.736   Anemia Panel: No results for input(s): VITAMINB12, FOLATE, FERRITIN, TIBC, IRON, RETICCTPCT in the last 72 hours. Urine analysis:    Component Value Date/Time   COLORURINE AMBER (A) 04/03/2019 1850   APPEARANCEUR CLEAR 04/03/2019 1850   LABSPEC 1.011 04/03/2019 1850   PHURINE 6.0 04/03/2019 1850   GLUCOSEU NEGATIVE 04/03/2019 1850   HGBUR NEGATIVE 04/03/2019 [place] 04/03/2019 1850   KETONESUR 80 (A) 04/03/2019 1850   PROTEINUR 30 (A) 04/03/2019 1850   NITRITE NEGATIVE 04/03/2019 1850   LEUKOCYTESUR NEGATIVE 04/03/2019 1850   Sepsis Labs: @LABRCNTIP (procalcitonin:4,lacticidven:4)  ) Recent Results (from the past 240 hour(s))  Blood culture (routine x 2)     Status: None (Preliminary result)   Collection Time: 04/03/19  7:52 PM   Specimen: BLOOD RIGHT HAND  Result Value Ref Range Status   Specimen Description BLOOD RIGHT HAND  Final   Special Requests   Final    BOTTLES DRAWN AEROBIC AND ANAEROBIC Blood Culture adequate volume   Culture   Final    NO GROWTH < 24 HOURS Performed at [facility], [place] [address] [place], [place] [address]    Report Status PENDING  Incomplete  Blood culture (routine x 2)     Status: None (Preliminary result)   Collection  Time: 04/03/19  8:13 PM   Specimen: BLOOD  Result Value Ref Range Status   Specimen Description BLOOD LEFT ANTECUBITAL  Final   Special Requests   Final    BOTTLES DRAWN AEROBIC AND ANAEROBIC Blood Culture adequate volume   Culture   Final    NO GROWTH < 24 HOURS Performed at [facility], [place] [address], [place], [place] [address]    Report Status PENDING  Incomplete  SARS CORONAVIRUS 2 (TAT 6-24 HRS) Nasopharyngeal Nasopharyngeal Swab     Status: None   Collection Time: 04/03/19  9:41 PM   Specimen: Nasopharyngeal Swab  Result Value Ref Range Status   SARS Coronavirus 2 NEGATIVE NEGATIVE Final    Comment: (NOTE) SARS-CoV-2 target nucleic acids are NOT DETECTED. The SARS-CoV-2 RNA is generally detectable in upper and lower respiratory specimens during the acute phase of infection. Negative results do not preclude SARS-CoV-2 infection, do not rule out co-infections with other pathogens, and should not be used as the sole basis for treatment or other patient management decisions.  Negative results must be combined with clinical observations, patient history, and epidemiological information. The expected result is Negative. Fact Sheet for Patients: SugarRoll.be Fact Sheet for Healthcare Providers: https://www.woods-mathews.com/ This test is not yet approved or cleared by the [place] FDA and  has been authorized for detection and/or diagnosis of SARS-CoV-2 by FDA under an Emergency Use Authorization (EUA). This EUA will remain  in effect (meaning this test can be used) for the duration of the COVID-19 declaration under Section 56 4(b)(1) of the Act, 21 U.S.C. section 360bbb-3(b)(1), unless the authorization is terminated or revoked sooner. Performed at [facility], [place] [address] [place], [place] [address]          Radiology Studies: Ct Abdomen Pelvis W Contrast  Result Date: 04/03/2019 CLINICAL DATA:   Generalized abdominal pain. History of ulcerative colitis. EXAM: CT ABDOMEN AND PELVIS WITH CONTRAST TECHNIQUE: Multidetector CT imaging of the abdomen and pelvis was performed using the standard protocol following bolus administration of intravenous contrast. CONTRAST:  136m OMNIPAQUE IOHEXOL 300 MG/ML  SOLN COMPARISON:  CT abdomen pelvis 02/11/2019 FINDINGS: Lower chest: Normal heart size. Minimal dependent atelectasis within the lung bases bilaterally. Hepatobiliary: Liver is diffusely low in attenuation compatible with steatosis. Gas within the gallbladder lumen and within the left hepatic lobe bile ducts centrally. Pancreas: The pancreas is diffusely thickened and edematous with peripancreatic fluid and fat stranding. Spleen: Unremarkable Adrenals/Urinary Tract: Normal adrenal glands. Kidneys enhance symmetrically with contrast. No hydronephrosis. Urinary bladder is unremarkable. High attenuation within the distal left ureter is favored to represent contrast material. Stomach/Bowel: There is mild wall thickening of the cecum and ascending colon. No evidence for small bowel obstruction. No free intraperitoneal air. Normal morphology of the stomach. Vascular/Lymphatic: Normal caliber abdominal aorta. No retroperitoneal lymphadenopathy. Reproductive: Unremarkable Other: Small fat containing left inguinal hernia. Musculoskeletal: No aggressive or acute appearing osseous lesions. Lumbar spine degenerative changes. IMPRESSION: 1. Findings compatible with acute pancreatitis. 2. Interval development of pneumobilia and small amount of gas within the gallbladder lumen. Recommend correlation for sphincterotomy. 3. Mild wall thickening of the cecum and ascending colon compatible with history of ulcerative colitis. 4. Hepatic steatosis. Electronically Signed   By: DLovey NewcomerM.D.   On: 04/03/2019 20:48   Dg Foot 2 Views Right  Result Date: 04/03/2019 CLINICAL DATA:  Pain about the dorsum of the right foot for 1 week.  No known injury. EXAM: RIGHT FOOT - 2 VIEW COMPARISON:  None. FINDINGS: There is no evidence of fracture or dislocation. There is no evidence of arthropathy or other focal bone abnormality. Soft tissues are unremarkable. IMPRESSION: Negative exam. Electronically Signed   By: TInge RiseM.D.   On: 04/03/2019 17:35   UKoreaAbdomen Limited Ruq  Result Date: 04/04/2019 CLINICAL DATA:  Pancreatitis. EXAM: ULTRASOUND ABDOMEN LIMITED RIGHT UPPER QUADRANT COMPARISON:  CT abdomen and pelvis 04/03/2019 FINDINGS: Gallbladder: Large volume gallbladder sludge without gallstones visualized. Small volume gas in the gallbladder as seen on CT. Borderline gallbladder wall thickening with 6 mm measurement felt to reflect an over estimation due to inclusion of trace pericholecystic fluid. No sonographic Murphy sign noted by sonographer. Common bile duct: Diameter: Within normal limits at 5 mm though overall suboptimally visualized Liver: Diffusely increased parenchymal echogenicity without focal lesion identified. Portal vein is patent on color Doppler imaging with normal direction of blood flow towards the liver. Other: None. IMPRESSION: 1. Gallbladder sludge without gallstones identified. Trace pericholecystic fluid and borderline gallbladder wall thickening may be related to regional  inflammation from pancreatitis. No sonographic Murphy sign to specifically suggest cholecystitis. 2. Hepatic steatosis. Electronically Signed   By: Logan Bores M.D.   On: 04/04/2019 08:38        Scheduled Meds: . folic acid  1 mg Oral Daily  . LORazepam  0-4 mg Intravenous Q4H   Followed by  . [START ON 04/05/2019] LORazepam  0-4 mg Intravenous Q8H  . multivitamin with minerals  1 tablet Oral Daily  . sodium chloride flush  3 mL Intravenous Q12H  . thiamine  100 mg Oral Daily   Or  . thiamine  100 mg Intravenous Daily   Continuous Infusions: . cefTRIAXone (ROCEPHIN)  IV    . famotidine (PEPCID) IV 20 mg (04/04/19 0130)  .  metronidazole 500 mg (04/04/19 0554)     LOS: 1 day    Time spent: 52mn    PDomenic Polite MD [facility]  04/04/2019, 10:56 AM

## 2019-04-04 NOTE — Plan of Care (Signed)
  Problem: Clinical Measurements: Goal: Respiratory complications will improve Outcome: Progressing   

## 2019-04-04 NOTE — Consult Note (Signed)
CROSS COVER [facility]-GI UNASSIGNED PATIENT Reason for Consult: Pancreatitis complicating UC. Referring Physician: THP  MCCLAIN SHALL is an 43 y.o. male.  HPI: Mr. Charles Hardy is a 43 year old white male who presented to the [facility] emergency room yesterday with a 1 week history of abdominal pain, gas bloating and anorexia.  He was diagnosed with ulcerative colitis 2 years ago and has been under the care of Dr.Gupta but recently transferred his care to a gastroenterologist at [facility] but cannot remember her name.  He claims has been some adjustments in the doses of his Humira but cannot tell me exactly what changes have been made. To the best of my understanding he is taking the Humira every 2 weeks.  His last dose of Humira was 10 days ago Over the last 7 to 10 days he has had some problems with constipation. This is a new problem for him.  He denies having any melena or hematochezia.Marland KitchenHe took some Dulcolax to help with symptoms but did not have any benefit from that. He denies having any fever chills or rigors.  He has had some problems with neutropenia in the recent past.  He was hospitalized hospitalized 4 weeks ago due to flareup of his UC. A CT scan of the abdomen pelvis done in the emergency room revealed acute pancreatitis with interventional development of pneumobilia and small amount of gas in the gallbladder lumen; mild wall thickening of the cecum was also noted along with hepatic steatosis.  Patient gives a longstanding history of consuming 8-10 beers per day for several years. He last consumed alcohol yesterday.  In the ER the patient had a transient episode of atrial fibrillation with RVR and had a total bili of two-point with mildly elevated transaminases. The atrial fibrillation subsequently converted to sinus tachycardia.  He denied having any fever chills or rigors but as his lactic acid levels were elevated blood cultures were drawn and he was started on antibiotics-ceftriaxone  and Flagyl. He appears to be doing somewhat better today and complains of mild abdominal pain and constipation.  Past Medical History:  Diagnosis Date  . Allergy   . Anxiety   . Alcohol abuse   . Ulcerative colitis ([place])    History reviewed. No pertinent surgical history.  Family History  Problem Relation Age of Onset  . Diabetes Mother   . Hypertension Mother   . Diabetes Maternal Grandfather   . Hyperlipidemia Maternal Grandfather   . Hypertension Maternal Grandfather    Social History:  reports that he has never smoked. He has never used smokeless tobacco. He reports current alcohol use. He reports that he does not use drugs.  Allergies:  Allergies  Allergen Reactions  . Bee Venom Anaphylaxis   Medications: I have reviewed the patient's current medications.  Results for orders placed or performed during the hospital encounter of 04/03/19 (from the past 48 hour(s))  CBC with Differential     Status: Abnormal   Collection Time: 04/03/19  5:48 PM  Result Value Ref Range   WBC 11.0 (H) 4.0 - 10.5 K/uL   RBC 4.30 4.22 - 5.81 MIL/uL   Hemoglobin 13.4 13.0 - 17.0 g/dL   HCT 38.9 (L) 39.0 - 52.0 %   MCV 90.5 80.0 - 100.0 fL   MCH 31.2 26.0 - 34.0 pg   MCHC 34.4 30.0 - 36.0 g/dL   RDW 15.7 (H) 11.5 - 15.5 %   Platelets 159 150 - 400 K/uL   nRBC 0.0 0.0 -  0.2 %   Neutrophils Relative % 85 %   Neutro Abs 9.4 (H) 1.7 - 7.7 K/uL   Lymphocytes Relative 5 %   Lymphs Abs 0.5 (L) 0.7 - 4.0 K/uL   Monocytes Relative 9 %   Monocytes Absolute 1.0 0.1 - 1.0 K/uL   Eosinophils Relative 0 %   Eosinophils Absolute 0.0 0.0 - 0.5 K/uL   Basophils Relative 0 %   Basophils Absolute 0.0 0.0 - 0.1 K/uL   Immature Granulocytes 1 %   Abs Immature Granulocytes 0.05 0.00 - 0.07 K/uL    Comment: Performed at [facility] [address], [place], [place] [address]  Comprehensive metabolic panel     Status: Abnormal   Collection Time: 04/03/19  5:48 PM  Result Value Ref Range   Sodium  126 (L) 135 - 145 mmol/L   Potassium 2.5 (LL) 3.5 - 5.1 mmol/L    Comment: CRITICAL RESULT CALLED TO, READ BACK BY AND VERIFIED WITH: K.BROWN,RN @ 1916 04/03/2019 WEBBERJ    Chloride 82 (L) 98 - 111 mmol/L   CO2 19 (L) 22 - 32 mmol/L   Glucose, Bld 106 (H) 70 - 99 mg/dL   BUN <5 (L) 6 - 20 mg/dL   Creatinine, Ser 0.62 0.61 - 1.24 mg/dL   Calcium 8.4 (L) 8.9 - 10.3 mg/dL   Total Protein 6.7 6.5 - 8.1 g/dL   Albumin 2.5 (L) 3.5 - 5.0 g/dL   AST 114 (H) 15 - 41 U/L   ALT 101 (H) 0 - 44 U/L   Alkaline Phosphatase 108 38 - 126 U/L   Total Bilirubin 2.1 (H) 0.3 - 1.2 mg/dL   GFR calc non Af Amer >60 >60 mL/min   GFR calc Af Amer >60 >60 mL/min   Anion gap 25 (H) 5 - 15    Comment: Performed at [facility], [place] [address], [place], [place] [address]  Lipase, blood     Status: None   Collection Time: 04/03/19  5:48 PM  Result Value Ref Range   Lipase 38 11 - 51 U/L    Comment: Performed at [facility] [address], [place], [place] [address]  Lactic acid, plasma     Status: Abnormal   Collection Time: 04/03/19  5:48 PM  Result Value Ref Range   Lactic Acid, Venous 3.2 (HH) 0.5 - 1.9 mmol/L    Comment: CRITICAL RESULT CALLED TO, READ BACK BY AND VERIFIED WITH: K.BROWN,RN @ 1908 04/03/2019 [place] Performed at [facility], [place] [address], [place],  [address]   Urinalysis, Routine w reflex microscopic     Status: Abnormal   Collection Time: 04/03/19  6:50 PM  Result Value Ref Range   Color, Urine AMBER (A) YELLOW    Comment: BIOCHEMICALS MAY BE AFFECTED BY COLOR   APPearance CLEAR CLEAR   Specific Gravity, Urine 1.011 1.005 - 1.030   pH 6.0 5.0 - 8.0   Glucose, UA NEGATIVE NEGATIVE mg/dL   Hgb urine dipstick NEGATIVE NEGATIVE   Bilirubin Urine NEGATIVE NEGATIVE   Ketones, ur 80 (A) NEGATIVE mg/dL   Protein, ur 30 (A) NEGATIVE mg/dL   Nitrite NEGATIVE NEGATIVE   Leukocytes,Ua NEGATIVE NEGATIVE   RBC / HPF 0-5 0 - 5 RBC/hpf   WBC, UA 0-5 0 - 5  WBC/hpf   Bacteria, UA RARE (A) NONE SEEN   Mucus PRESENT     Comment: Performed at [facility], [place] [address],  [place], [place] [address]  Lactic acid, plasma     Status: None   Collection Time: 04/03/19  8:15 PM  Result Value Ref Range   Lactic Acid, Venous 1.6 0.5 - 1.9 mmol/L    Comment: Performed at [facility] [address] [place], [place] [address]  SARS CORONAVIRUS 2 (TAT 6-24 HRS) Nasopharyngeal Nasopharyngeal Swab     Status: None   Collection Time: 04/03/19  9:41 PM   Specimen: Nasopharyngeal Swab  Result Value Ref Range   SARS Coronavirus 2 NEGATIVE NEGATIVE    Comment: (NOTE) SARS-CoV-2 target nucleic acids are NOT DETECTED. The SARS-CoV-2 RNA is generally detectable in upper and lower respiratory specimens during the acute phase of infection. Negative results do not preclude SARS-CoV-2 infection, do not rule out co-infections with other pathogens, and should not be used as the sole basis for treatment or other patient management decisions. Negative results must be combined with clinical observations, patient history, and epidemiological information. The expected result is Negative. Fact Sheet for Patients: SugarRoll.be Fact Sheet for Healthcare Providers: https://www.woods-mathews.com/ This test is not yet approved or cleared by the [place] FDA and  has been authorized for detection and/or diagnosis of SARS-CoV-2 by FDA under an Emergency Use Authorization (EUA). This EUA will remain  in effect (meaning this test can be used) for the duration of the COVID-19 declaration under Section 56 4(b)(1) of the Act, 21 U.S.C. section 360bbb-3(b)(1), unless the authorization is terminated or revoked sooner. Performed at [facility], [place] [address] [place], [place] [address]   C-reactive protein     Status: Abnormal   Collection Time: 04/03/19 10:53 PM  Result Value Ref Range   CRP 25.7 (H) <1.0 mg/dL     Comment: Performed at [facility] [address] [place], [place] [address]  Sedimentation rate     Status: Abnormal   Collection Time: 04/03/19 10:53 PM  Result Value Ref Range   Sed Rate 25 (H) 0 - 16 mm/hr    Comment: Performed at [facility] [address], [place], [place] [address]  Basic metabolic panel     Status: Abnormal   Collection Time: 04/03/19 10:53 PM  Result Value Ref Range   Sodium 130 (L) 135 - 145 mmol/L   Potassium 2.9 (L) 3.5 - 5.1 mmol/L   Chloride 89 (L) 98 - 111 mmol/L   CO2 22 22 - 32 mmol/L   Glucose, Bld 107 (H) 70 - 99 mg/dL   BUN <5 (L) 6 - 20 mg/dL   Creatinine, Ser 0.82 0.61 - 1.24 mg/dL   Calcium 7.4 (L) 8.9 - 10.3 mg/dL   GFR calc non Af Amer >60 >60 mL/min   GFR calc Af Amer >60 >60 mL/min   Anion gap 19 (H) 5 - 15    Comment: Performed at [facility] [address], [place], [place] [address]  Basic metabolic panel     Status: Abnormal   Collection Time: 04/04/19  3:02 AM  Result Value Ref Range   Sodium 130 (L) 135 - 145 mmol/L   Potassium 3.3 (L) 3.5 - 5.1 mmol/L   Chloride 94 (L) 98 - 111 mmol/L   CO2 23 22 - 32 mmol/L   Glucose, Bld 120 (H) 70 - 99 mg/dL   BUN <5 (L) 6 - 20 mg/dL   Creatinine, Ser 0.66 0.61 - 1.24 mg/dL   Calcium 7.4 (L) 8.9 - 10.3 mg/dL   GFR  calc non Af Amer >60 >60 mL/min   GFR calc Af Amer >60 >60 mL/min   Anion gap 13 5 - 15    Comment: Performed at [facility] [address], [place], [place] [address]  Hepatic function panel     Status: Abnormal   Collection Time: 04/04/19  3:02 AM  Result Value Ref Range   Total Protein 5.8 (L) 6.5 - 8.1 g/dL   Albumin 2.2 (L) 3.5 - 5.0 g/dL   AST 92 (H) 15 - 41 U/L   ALT 87 (H) 0 - 44 U/L   Alkaline Phosphatase 85 38 - 126 U/L   Total Bilirubin 1.9 (H) 0.3 - 1.2 mg/dL   Bilirubin, Direct 0.7 (H) 0.0 - 0.2 mg/dL   Indirect Bilirubin 1.2 (H) 0.3 - 0.9 mg/dL    Comment: Performed at [facility] [address], [place], [place]  [address]  Magnesium     Status: None   Collection Time: 04/04/19  3:02 AM  Result Value Ref Range   Magnesium 1.9 1.7 - 2.4 mg/dL    Comment: Performed at [facility] [address] [place], [place] [address]  Phosphorus     Status: Abnormal   Collection Time: 04/04/19  3:02 AM  Result Value Ref Range   Phosphorus 2.3 (L) 2.5 - 4.6 mg/dL    Comment: Performed at [facility] [address], [place], [place] [address]  TSH     Status: None   Collection Time: 04/04/19  3:02 AM  Result Value Ref Range   TSH 0.736 0.350 - 4.500 uIU/mL    Comment: Performed by a 3rd Generation assay with a functional sensitivity of <=0.01 uIU/mL. Performed at [facility], [place] [address] [place], [place] [address]   Glucose, capillary     Status: Abnormal   Collection Time: 04/04/19  6:11 AM  Result Value Ref Range   Glucose-Capillary 135 (H) 70 - 99 mg/dL   Comment 1 Notify RN    Ct Abdomen Pelvis W Contrast  Result Date: 04/03/2019 CLINICAL DATA:  Generalized abdominal pain. History of ulcerative colitis. EXAM: CT ABDOMEN AND PELVIS WITH CONTRAST TECHNIQUE: Multidetector CT imaging of the abdomen and pelvis was performed using the standard protocol following bolus administration of intravenous contrast. CONTRAST:  19m OMNIPAQUE IOHEXOL 300 MG/ML  SOLN COMPARISON:  CT abdomen pelvis 02/11/2019 FINDINGS: Lower chest: Normal heart size. Minimal dependent atelectasis within the lung bases bilaterally. Hepatobiliary: Liver is diffusely low in attenuation compatible with steatosis. Gas within the gallbladder lumen and within the left hepatic lobe bile ducts centrally. Pancreas: The pancreas is diffusely thickened and edematous with peripancreatic fluid and fat stranding. Spleen: Unremarkable Adrenals/Urinary Tract: Normal adrenal glands. Kidneys enhance symmetrically with contrast. No hydronephrosis. Urinary bladder is unremarkable. High attenuation within the distal left ureter is favored to  represent contrast material. Stomach/Bowel: There is mild wall thickening of the cecum and ascending colon. No evidence for small bowel obstruction. No free intraperitoneal air. Normal morphology of the stomach. Vascular/Lymphatic: Normal caliber abdominal aorta. No retroperitoneal lymphadenopathy. Reproductive: Unremarkable Other: Small fat containing left inguinal hernia. Musculoskeletal: No aggressive or acute appearing osseous lesions. Lumbar spine degenerative changes. IMPRESSION: 1. Findings compatible with acute pancreatitis. 2. Interval development of pneumobilia and small amount of gas within the gallbladder lumen. Recommend correlation for sphincterotomy. 3. Mild wall thickening of the cecum and ascending colon compatible with history of ulcerative colitis. 4. Hepatic steatosis. Electronically Signed  By: Lovey Newcomer M.D.   On: 04/03/2019 20:48   Dg Foot 2 Views Right  Result Date: 04/03/2019 CLINICAL DATA:  Pain about the dorsum of the right foot for 1 week. No known injury. EXAM: RIGHT FOOT - 2 VIEW COMPARISON:  None. FINDINGS: There is no evidence of fracture or dislocation. There is no evidence of arthropathy or other focal bone abnormality. Soft tissues are unremarkable. IMPRESSION: Negative exam. Electronically Signed   By: Inge Rise M.D.   On: 04/03/2019 17:35   Review of Systems  Constitutional: Negative.   HENT: Negative.   Eyes: Negative.   Respiratory: Negative.   Cardiovascular: Negative.   Gastrointestinal: Positive for abdominal pain and constipation. Negative for blood in stool, diarrhea, heartburn, melena, nausea and vomiting.  Genitourinary: Negative.   Musculoskeletal: Negative.   Skin: Negative.   Neurological: Negative.   Endo/Heme/Allergies: Negative.   Psychiatric/Behavioral: Positive for substance abuse.   Blood pressure (!) 132/96, pulse (!) 114, temperature 97.8 F (36.6 C), temperature source Oral, resp. rate (!) 29, height 5' 11"  (1.803 m), weight  89.7 kg, SpO2 99 %. Physical Exam  Constitutional: He appears well-developed and well-nourished.  HENT:  Head: Normocephalic and atraumatic.  Eyes: Pupils are equal, round, and reactive to light. Conjunctivae and EOM are normal.  Neck: Normal range of motion.  Cardiovascular: Tachycardia present.  Respiratory: Effort normal and breath sounds normal.  GI: Soft. Bowel sounds are normal. He exhibits no distension and no mass. There is abdominal tenderness in the periumbilical area. There is no rebound and no guarding.   Assessment/Plan: 1) Acute alcoholic pancreatitis with pneumobilia-gallbladder ultrasound shows sludge without gallstones and trace pericholecystic fluid with borderline gallbladder wall thickening probably related to the pancreatitis along with hepatic steatosis. I had extensive discussion with the patient about alcohol cessation and the complications of chronic pancreatic insufficiency complicating his IBD.  Agree with keeping him NPO. for now with CIWA protocol for alcohol withdrawal 2) Ulcerative colitis on Humira-patient is in the process of transferring his care to [facility]. He is already had his initial evaluation there. 3) Hepatic steatosis-combination of obesity and alcohol abuse 4) Change in bowel habits-constipation for the last week; we will try some stool softeners once the patient is able to take meds orally. 5) Transient atrial fibrillation with RVR currently in sinus tachycardia,  6) Electrolyte abnormalities including hyponatremia and hypokalemia-we will need correction.  Juanita Craver 04/04/2019, 7:54 AM

## 2019-04-05 DIAGNOSIS — F1010 Alcohol abuse, uncomplicated: Secondary | ICD-10-CM

## 2019-04-05 DIAGNOSIS — K859 Acute pancreatitis without necrosis or infection, unspecified: Secondary | ICD-10-CM

## 2019-04-05 LAB — CBC
HCT: 35.8 % — ABNORMAL LOW (ref 39.0–52.0)
Hemoglobin: 12.2 g/dL — ABNORMAL LOW (ref 13.0–17.0)
MCH: 31.7 pg (ref 26.0–34.0)
MCHC: 34.1 g/dL (ref 30.0–36.0)
MCV: 93 fL (ref 80.0–100.0)
Platelets: 218 10*3/uL (ref 150–400)
RBC: 3.85 MIL/uL — ABNORMAL LOW (ref 4.22–5.81)
RDW: 16.3 % — ABNORMAL HIGH (ref 11.5–15.5)
WBC: 6.8 10*3/uL (ref 4.0–10.5)
nRBC: 0 % (ref 0.0–0.2)

## 2019-04-05 LAB — GLUCOSE, CAPILLARY: Glucose-Capillary: 122 mg/dL — ABNORMAL HIGH (ref 70–99)

## 2019-04-05 LAB — COMPREHENSIVE METABOLIC PANEL
ALT: 74 U/L — ABNORMAL HIGH (ref 0–44)
AST: 65 U/L — ABNORMAL HIGH (ref 15–41)
Albumin: 2.1 g/dL — ABNORMAL LOW (ref 3.5–5.0)
Alkaline Phosphatase: 85 U/L (ref 38–126)
Anion gap: 12 (ref 5–15)
BUN: <5 mg/dL — ABNORMAL LOW (ref 6–20)
CO2: 25 mmol/L (ref 22–32)
Calcium: 7.9 mg/dL — ABNORMAL LOW (ref 8.9–10.3)
Chloride: 95 mmol/L — ABNORMAL LOW (ref 98–111)
Creatinine, Ser: 0.69 mg/dL (ref 0.61–1.24)
GFR calc Af Amer: >60 mL/min (ref >60)
GFR calc non Af Amer: >60 mL/min (ref >60)
Glucose, Bld: 117 mg/dL — ABNORMAL HIGH (ref 70–99)
Potassium: 3.3 mmol/L — ABNORMAL LOW (ref 3.5–5.1)
Sodium: 132 mmol/L — ABNORMAL LOW (ref 135–145)
Total Bilirubin: 1.2 mg/dL (ref 0.3–1.2)
Total Protein: 6 g/dL — ABNORMAL LOW (ref 6.5–8.1)

## 2019-04-05 LAB — LIPASE, BLOOD: Lipase: 19 U/L (ref 11–51)

## 2019-04-05 MED ORDER — POTASSIUM CHLORIDE IN NACL 40-0.9 MEQ/L-% IV SOLN
INTRAVENOUS | Status: DC
  Administered 2019-04-05 – 2019-04-06 (×2): 75 mL/h via INTRAVENOUS
  Administered 2019-04-06: 50 mL/h via INTRAVENOUS
  Filled 2019-04-05 (×4): qty 1000

## 2019-04-05 MED ORDER — PANTOPRAZOLE SODIUM 40 MG PO TBEC
40.0000 mg | DELAYED_RELEASE_TABLET | Freq: Every day | ORAL | Status: DC
  Administered 2019-04-06 – 2019-04-07 (×2): 40 mg via ORAL
  Filled 2019-04-05 (×2): qty 1

## 2019-04-05 MED ORDER — MORPHINE SULFATE (PF) 2 MG/ML IV SOLN: 2.0000 mg | INTRAVENOUS | Status: DC | PRN

## 2019-04-05 MED ORDER — OXYCODONE HCL 5 MG PO TABS
5.0000 mg | ORAL_TABLET | Freq: Four times a day (QID) | ORAL | Status: DC | PRN
  Administered 2019-04-05 – 2019-04-06 (×4): 5 mg via ORAL
  Filled 2019-04-05 (×4): qty 1

## 2019-04-05 MED ORDER — POTASSIUM CHLORIDE CRYS ER 20 MEQ PO TBCR
40.0000 meq | EXTENDED_RELEASE_TABLET | Freq: Once | ORAL | Status: AC
  Administered 2019-04-05: 40 meq via ORAL
  Filled 2019-04-05: qty 2

## 2019-04-05 NOTE — Progress Notes (Signed)
PROGRESS NOTE    Charles Hardy  TXM:[id number] DOB: 03/20/1976 DOA: 04/03/2019 PCP: Patient, No Pcp Per  Brief Narrative: Charles Hardy is a 43 y.o. male with medical history significant for ulcerative colitis and alcohol dependence, presented to the emergency department for evaluation of abdominal pain.  Patient reports approximately 1 week of progressive abdominal pain with loss of appetite. -He recently established care with gastroenterology at [facility] for ulcerative colitis and was treated with Humira, last dose was about 10 days ago -In the emergency room he was noted to be afebrile tachycardic, he had a transient episode of A. fib RVR, subsequently converted to sinus tachycardia, CT abdomen pelvis was concerning for acute pancreatitis with pneumobilia, also noted to have mildly elevated AST ALT and a bili of 2.1 -Gastroenterology was consulted overnight  Assessment & Plan:  Acute alcoholic pancreatitis, pneumobilia -long-term history of alcohol abuse, last 2 weeks was drinking heavily 12 pack a day -Given concern for pneumobilia on CT abdomen pelvis, right upper quadrant ultrasound was negative for gallstones, no evidence of cholecystitis -Remains on empiric ceftriaxone and Flagyl due to initial concern for questionable pneumobilia, continue this 1 more day, DC antibiotics tomorrow and monitor -Clinically improving, will advance to clear liquid diet today, DC IV fentanyl, continue IV fluids with potassium -Appreciate Gastroenterology input  Transient A. fib RVR -Resolved, now with sinus tachycardia likely in the setting of acute catecholamine surge -Now in sinus tachycardia which could be from mild withdrawal, monitor  Alcohol dependence -Heavy abuse recently, at high risk of withdrawal, went into the EtOH withdrawal during his recent admission -Continue thiamine, monitor with CIWA protocol -Counseled  Alcoholic hepatic steatosis -Counseled  Hyponatremia -Improving with  hydration  Hypokalemia -Continue replacement, improving  Ulcerative colitis -Followed by gastroenterology at [facility] approximately 10 days ago  DVT prophylaxis: Lovenox Code Status: Full code Family Communication: No family at bedside Disposition Plan: Home pending clinical improvement  Consultants:   Gastroenterology   Procedures:   Antimicrobials:    Subjective: -Feels better, has low-grade sinus tachycardia overnight, denies overt withdrawal symptoms, had some diarrhea last night, mild epigastric discomfort but no vomiting now  Objective: Vitals:   04/04/19 2311 04/05/19 0000 04/05/19 0320 04/05/19 0822  BP: (!) 146/107  (!) 132/102 (!) 132/96  Pulse: (!) 112  (!) 114 (!) 110  Resp: (!) 30 (!) 22 (!) 22 (!) 23  Temp: 98.3 F (36.8 C)  98.2 F (36.8 C) 99.5 F (37.5 C)  TempSrc: Oral  Oral Oral  SpO2: 96%  94% 93%  Weight:      Height:        Intake/Output Summary (Last 24 hours) at 04/05/2019 1217 Last data filed at 04/05/2019 0845 Gross per 24 hour  Intake 1670.56 ml  Output --  Net 1670.56 ml   Filed Weights   04/03/19 1630 04/04/19 0101  Weight: 85.7 kg 89.7 kg    Examination:  Gen: Male, sitting up in bed, AAO x3, no distress HEENT: PERRLA, Neck supple, no JVD Lungs: Clear CVS: S1-S2, regular rhythm, tachycardic Abd: Soft, mild epigastric tenderness, negative Murphy sign, nondistended, bowel sounds present Extremities: No edema Neuro, no asterixis, no tremors Psychiatry: Judgement and insight appear normal, no tremors today    Data Reviewed:   CBC: Recent Labs  Lab 04/03/19 1748 04/05/19 0313  WBC 11.0* 6.8  NEUTROABS 9.4*  --   HGB 13.4 12.2*  HCT 38.9* 35.8*  MCV 90.5 93.0  PLT 159 218   Basic  Metabolic Panel: Recent Labs  Lab 04/03/19 1748 04/03/19 2253 04/04/19 0302 04/04/19 1051 04/05/19 0313  NA 126* 130* 130* 128* 132*  K 2.5* 2.9* 3.3* 3.3* 3.3*  CL 82* 89* 94* 93* 95*  CO2 19* 22 23 24 25    GLUCOSE 106* 107* 120* 135* 117*  BUN <5* <5* <5* <5* <5*  CREATININE 0.62 0.82 0.66 0.57* 0.69  CALCIUM 8.4* 7.4* 7.4* 7.7* 7.9*  MG  --   --  1.9  --   --   PHOS  --   --  2.3*  --   --    GFR: Estimated Creatinine Clearance: 128.1 mL/min (by C-G formula based on SCr of 0.69 mg/dL). Liver Function Tests: Recent Labs  Lab 04/03/19 1748 04/04/19 0302 04/05/19 0313  AST 114* 92* 65*  ALT 101* 87* 74*  ALKPHOS 108 85 85  BILITOT 2.1* 1.9* 1.2  PROT 6.7 5.8* 6.0*  ALBUMIN 2.5* 2.2* 2.1*   Recent Labs  Lab 04/03/19 1748 04/05/19 0313  LIPASE 38 19   No results for input(s): AMMONIA in the last 168 hours. Coagulation Profile: No results for input(s): INR, PROTIME in the last 168 hours. Cardiac Enzymes: No results for input(s): CKTOTAL, CKMB, CKMBINDEX, TROPONINI in the last 168 hours. BNP (last 3 results) No results for input(s): PROBNP in the last 8760 hours. HbA1C: No results for input(s): HGBA1C in the last 72 hours. CBG: Recent Labs  Lab 04/04/19 0611 04/05/19 0615  GLUCAP 135* 122*   Lipid Profile: No results for input(s): CHOL, HDL, LDLCALC, TRIG, CHOLHDL, LDLDIRECT in the last 72 hours. Thyroid Function Tests: Recent Labs    04/04/19 0302  TSH 0.736   Anemia Panel: No results for input(s): VITAMINB12, FOLATE, FERRITIN, TIBC, IRON, RETICCTPCT in the last 72 hours. Urine analysis:    Component Value Date/Time   COLORURINE AMBER (A) 04/03/2019 1850   APPEARANCEUR CLEAR 04/03/2019 1850   LABSPEC 1.011 04/03/2019 1850   PHURINE 6.0 04/03/2019 1850   GLUCOSEU NEGATIVE 04/03/2019 1850   HGBUR NEGATIVE 04/03/2019 [place] 04/03/2019 1850   KETONESUR 80 (A) 04/03/2019 1850   PROTEINUR 30 (A) 04/03/2019 1850   NITRITE NEGATIVE 04/03/2019 1850   LEUKOCYTESUR NEGATIVE 04/03/2019 1850   Sepsis Labs: @LABRCNTIP (procalcitonin:4,lacticidven:4)  ) Recent Results (from the past 240 hour(s))  Blood culture (routine x 2)     Status: None  (Preliminary result)   Collection Time: 04/03/19  7:52 PM   Specimen: BLOOD RIGHT HAND  Result Value Ref Range Status   Specimen Description BLOOD RIGHT HAND  Final   Special Requests   Final    BOTTLES DRAWN AEROBIC AND ANAEROBIC Blood Culture adequate volume   Culture   Final    NO GROWTH 2 DAYS Performed at [facility], [place] [address] [place], [place] [address]    Report Status PENDING  Incomplete  Blood culture (routine x 2)     Status: None (Preliminary result)   Collection Time: 04/03/19  8:13 PM   Specimen: BLOOD  Result Value Ref Range Status   Specimen Description BLOOD LEFT ANTECUBITAL  Final   Special Requests   Final    BOTTLES DRAWN AEROBIC AND ANAEROBIC Blood Culture adequate volume   Culture   Final    NO GROWTH 2 DAYS Performed at [facility], [place] [address], [place], [place] [address]    Report Status PENDING  Incomplete  SARS CORONAVIRUS 2 (TAT 6-24 HRS) Nasopharyngeal Nasopharyngeal Swab  Status: None   Collection Time: 04/03/19  9:41 PM   Specimen: Nasopharyngeal Swab  Result Value Ref Range Status   SARS Coronavirus 2 NEGATIVE NEGATIVE Final    Comment: (NOTE) SARS-CoV-2 target nucleic acids are NOT DETECTED. The SARS-CoV-2 RNA is generally detectable in upper and lower respiratory specimens during the acute phase of infection. Negative results do not preclude SARS-CoV-2 infection, do not rule out co-infections with other pathogens, and should not be used as the sole basis for treatment or other patient management decisions. Negative results must be combined with clinical observations, patient history, and epidemiological information. The expected result is Negative. Fact Sheet for Patients: SugarRoll.be Fact Sheet for Healthcare Providers: https://www.woods-mathews.com/ This test is not yet approved or cleared by the [place] FDA and  has been authorized for detection and/or diagnosis of  SARS-CoV-2 by FDA under an Emergency Use Authorization (EUA). This EUA will remain  in effect (meaning this test can be used) for the duration of the COVID-19 declaration under Section 56 4(b)(1) of the Act, 21 U.S.C. section 360bbb-3(b)(1), unless the authorization is terminated or revoked sooner. Performed at [facility], [place] [address] [place], [place] [address]          Radiology Studies: Ct Abdomen Pelvis W Contrast  Result Date: 04/03/2019 CLINICAL DATA:  Generalized abdominal pain. History of ulcerative colitis. EXAM: CT ABDOMEN AND PELVIS WITH CONTRAST TECHNIQUE: Multidetector CT imaging of the abdomen and pelvis was performed using the standard protocol following bolus administration of intravenous contrast. CONTRAST:  175m OMNIPAQUE IOHEXOL 300 MG/ML  SOLN COMPARISON:  CT abdomen pelvis 02/11/2019 FINDINGS: Lower chest: Normal heart size. Minimal dependent atelectasis within the lung bases bilaterally. Hepatobiliary: Liver is diffusely low in attenuation compatible with steatosis. Gas within the gallbladder lumen and within the left hepatic lobe bile ducts centrally. Pancreas: The pancreas is diffusely thickened and edematous with peripancreatic fluid and fat stranding. Spleen: Unremarkable Adrenals/Urinary Tract: Normal adrenal glands. Kidneys enhance symmetrically with contrast. No hydronephrosis. Urinary bladder is unremarkable. High attenuation within the distal left ureter is favored to represent contrast material. Stomach/Bowel: There is mild wall thickening of the cecum and ascending colon. No evidence for small bowel obstruction. No free intraperitoneal air. Normal morphology of the stomach. Vascular/Lymphatic: Normal caliber abdominal aorta. No retroperitoneal lymphadenopathy. Reproductive: Unremarkable Other: Small fat containing left inguinal hernia. Musculoskeletal: No aggressive or acute appearing osseous lesions. Lumbar spine degenerative changes. IMPRESSION: 1.  Findings compatible with acute pancreatitis. 2. Interval development of pneumobilia and small amount of gas within the gallbladder lumen. Recommend correlation for sphincterotomy. 3. Mild wall thickening of the cecum and ascending colon compatible with history of ulcerative colitis. 4. Hepatic steatosis. Electronically Signed   By: DLovey NewcomerM.D.   On: 04/03/2019 20:48   Dg Foot 2 Views Right  Result Date: 04/03/2019 CLINICAL DATA:  Pain about the dorsum of the right foot for 1 week. No known injury. EXAM: RIGHT FOOT - 2 VIEW COMPARISON:  None. FINDINGS: There is no evidence of fracture or dislocation. There is no evidence of arthropathy or other focal bone abnormality. Soft tissues are unremarkable. IMPRESSION: Negative exam. Electronically Signed   By: TInge RiseM.D.   On: 04/03/2019 17:35   UKoreaAbdomen Limited Ruq  Result Date: 04/04/2019 CLINICAL DATA:  Pancreatitis. EXAM: ULTRASOUND ABDOMEN LIMITED RIGHT UPPER QUADRANT COMPARISON:  CT abdomen and pelvis 04/03/2019 FINDINGS: Gallbladder: Large volume gallbladder sludge without gallstones visualized. Small volume gas in the gallbladder as seen on CT.  Borderline gallbladder wall thickening with 6 mm measurement felt to reflect an over estimation due to inclusion of trace pericholecystic fluid. No sonographic Murphy sign noted by sonographer. Common bile duct: Diameter: Within normal limits at 5 mm though overall suboptimally visualized Liver: Diffusely increased parenchymal echogenicity without focal lesion identified. Portal vein is patent on color Doppler imaging with normal direction of blood flow towards the liver. Other: None. IMPRESSION: 1. Gallbladder sludge without gallstones identified. Trace pericholecystic fluid and borderline gallbladder wall thickening may be related to regional inflammation from pancreatitis. No sonographic Murphy sign to specifically suggest cholecystitis. 2. Hepatic steatosis. Electronically Signed   By: Logan Bores M.D.   On: 04/04/2019 08:38        Scheduled Meds:  enoxaparin (LOVENOX) injection  40 mg Subcutaneous H74F   folic acid  1 mg Oral Daily   LORazepam  0-4 mg Intravenous Q4H   Followed by   LORazepam  0-4 mg Intravenous Q8H   multivitamin with minerals  1 tablet Oral Daily   sodium chloride flush  3 mL Intravenous Q12H   thiamine  100 mg Oral Daily   Or   thiamine  100 mg Intravenous Daily   Continuous Infusions:  0.9 % NaCl with KCl 40 mEq / L     cefTRIAXone (ROCEPHIN)  IV 2 g (04/04/19 2042)   famotidine (PEPCID) IV 20 mg (04/04/19 2310)   metronidazole 500 mg (04/05/19 0446)     LOS: 2 days    Time spent: 62mn    PDomenic Polite MD [facility]  04/05/2019, 12:17 PM

## 2019-04-05 NOTE — Plan of Care (Signed)
  Problem: Clinical Measurements: Goal: Will remain free from infection Outcome: Progressing   Problem: Activity: Goal: Risk for activity intolerance will decrease Outcome: Progressing   

## 2019-04-05 NOTE — Progress Notes (Addendum)
Daily Rounding Note  04/05/2019, 11:40 AM  LOS: 2 days   SUBJECTIVE:   Chief complaint: Acute pancreatitis, probably alcoholic. Left upper abdominal pain which she came in with is resolved.  Has some lingering right upper quadrant pain which he associates with his colitis.  Had 3 bowel movements last night, 3 this morning.  Still n.p.o.  OBJECTIVE:         Vital signs in last 24 hours:    Temp:  [98.2 F (36.8 C)-99.5 F (37.5 C)] 99.5 F (37.5 C) (11/30 0822) Pulse Rate:  [104-115] 110 (11/30 0822) Resp:  [22-33] 23 (11/30 0822) BP: (132-146)/(96-107) 132/96 (11/30 0822) SpO2:  [93 %-98 %] 93 % (11/30 0822) Last BM Date: 03/28/19 Filed Weights   04/03/19 1630 04/04/19 0101  Weight: 85.7 kg 89.7 kg   General: Pale.  Does not look acutely ill.  Sitting up in bed comfortable. Heart: RRR Chest: Clear without labored breathing Abdomen: Soft.  Minimal tenderness in the right upper quadrant no guarding or rebound.  Active bowel sounds. Extremities: No CCE Neuro/Psych: X3.  No tremors, no gross weakness  Intake/Output from previous day: 11/29 0701 - 11/30 0700 In: 1670.6 [P.O.:240; I.V.:903.3; IV Piggyback:527.3] Out: -   Intake/Output this shift: Total I/O In: 120 [P.O.:120] Out: -   Lab Results: Recent Labs    04/03/19 1748 04/05/19 0313  WBC 11.0* 6.8  HGB 13.4 12.2*  HCT 38.9* 35.8*  PLT 159 218   BMET Recent Labs    04/04/19 0302 04/04/19 1051 04/05/19 0313  NA 130* 128* 132*  K 3.3* 3.3* 3.3*  CL 94* 93* 95*  CO2 23 24 25   GLUCOSE 120* 135* 117*  BUN <5* <5* <5*  CREATININE 0.66 0.57* 0.69  CALCIUM 7.4* 7.7* 7.9*   LFT Recent Labs    04/03/19 1748 04/04/19 0302 04/05/19 0313  PROT 6.7 5.8* 6.0*  ALBUMIN 2.5* 2.2* 2.1*  AST 114* 92* 65*  ALT 101* 87* 74*  ALKPHOS 108 85 85  BILITOT 2.1* 1.9* 1.2  BILIDIR  --  0.7*  --   IBILI  --  1.2*  --    PT/INR No results for input(s):  LABPROT, INR in the last 72 hours. Hepatitis Panel No results for input(s): HEPBSAG, HCVAB, HEPAIGM, HEPBIGM in the last 72 hours.  Studies/Results: Ct Abdomen Pelvis W Contrast  Result Date: 04/03/2019 CLINICAL DATA:  Generalized abdominal pain. History of ulcerative colitis. EXAM: CT ABDOMEN AND PELVIS WITH CONTRAST TECHNIQUE: Multidetector CT imaging of the abdomen and pelvis was performed using the standard protocol following bolus administration of intravenous contrast. CONTRAST:  114m OMNIPAQUE IOHEXOL 300 MG/ML  SOLN COMPARISON:  CT abdomen pelvis 02/11/2019 FINDINGS: Lower chest: Normal heart size. Minimal dependent atelectasis within the lung bases bilaterally. Hepatobiliary: Liver is diffusely low in attenuation compatible with steatosis. Gas within the gallbladder lumen and within the left hepatic lobe bile ducts centrally. Pancreas: The pancreas is diffusely thickened and edematous with peripancreatic fluid and fat stranding. Spleen: Unremarkable Adrenals/Urinary Tract: Normal adrenal glands. Kidneys enhance symmetrically with contrast. No hydronephrosis. Urinary bladder is unremarkable. High attenuation within the distal left ureter is favored to represent contrast material. Stomach/Bowel: There is mild wall thickening of the cecum and ascending colon. No evidence for small bowel obstruction. No free intraperitoneal air. Normal morphology of the stomach. Vascular/Lymphatic: Normal caliber abdominal aorta. No retroperitoneal lymphadenopathy. Reproductive: Unremarkable Other: Small fat containing left inguinal hernia. Musculoskeletal: No aggressive or acute  appearing osseous lesions. Lumbar spine degenerative changes. IMPRESSION: 1. Findings compatible with acute pancreatitis. 2. Interval development of pneumobilia and small amount of gas within the gallbladder lumen. Recommend correlation for sphincterotomy. 3. Mild wall thickening of the cecum and ascending colon compatible with history of  ulcerative colitis. 4. Hepatic steatosis. Electronically Signed   By: Lovey Newcomer M.D.   On: 04/03/2019 20:48   Dg Foot 2 Views Right  Result Date: 04/03/2019 CLINICAL DATA:  Pain about the dorsum of the right foot for 1 week. No known injury. EXAM: RIGHT FOOT - 2 VIEW COMPARISON:  None. FINDINGS: There is no evidence of fracture or dislocation. There is no evidence of arthropathy or other focal bone abnormality. Soft tissues are unremarkable. IMPRESSION: Negative exam. Electronically Signed   By: Inge Rise M.D.   On: 04/03/2019 17:35   US Abdomen Limited Ruq  Result Date: 04/04/2019 CLINICAL DATA:  Pancreatitis. EXAM: ULTRASOUND ABDOMEN LIMITED RIGHT UPPER QUADRANT COMPARISON:  CT abdomen and pelvis 04/03/2019 FINDINGS: Gallbladder: Large volume gallbladder sludge without gallstones visualized. Small volume gas in the gallbladder as seen on CT. Borderline gallbladder wall thickening with 6 mm measurement felt to reflect an over estimation due to inclusion of trace pericholecystic fluid. No sonographic Murphy sign noted by sonographer. Common bile duct: Diameter: Within normal limits at 5 mm though overall suboptimally visualized Liver: Diffusely increased parenchymal echogenicity without focal lesion identified. Portal vein is patent on color Doppler imaging with normal direction of blood flow towards the liver. Other: None. IMPRESSION: 1. Gallbladder sludge without gallstones identified. Trace pericholecystic fluid and borderline gallbladder wall thickening may be related to regional inflammation from pancreatitis. No sonographic Murphy sign to specifically suggest cholecystitis. 2. Hepatic steatosis. Electronically Signed   By: Logan Bores M.D.   On: 04/04/2019 08:38    ASSESMENT:   *   Acute pancreatitis.  [place] alcohol induced as he admitts to 8-10 beers daily Last EtOH 11/28.  CT w acute pancreatitis with pneumobilia and gas in gallbladder lumen, thickened cecum and hepatic  steatosis.  *    Mild LFT elevation.    *    Ulcerative colitis diagnosed 2 years ago.  Followed at [facility] and by Dr. [name] at [facility]..  On Humira, last dose ~ 11/19.   CT 10/8 showed pancolitis, possible focal narrowing in the transverse colon but no obstruction.  Hepatic steatosis.  No pancreatitis, no pneumobilia. Hospitalized early October due to UC flare.   Treated with IV Solu-Medrol and discharged on week prednisone taper.  He followed up with Dr. [name].  In the past couple of weeks Humira was increased from every 2 weeks to weekly.  *    Transient A. fib with RVR.  Converted spontaneously to sinus tachycardia.   PLAN   *   Diet clear, advance to soft if tolerated.      Azucena Freed  04/05/2019, 11:40 AM Phone [phone]

## 2019-04-06 DIAGNOSIS — K838 Other specified diseases of biliary tract: Secondary | ICD-10-CM

## 2019-04-06 DIAGNOSIS — K5100 Ulcerative (chronic) pancolitis without complications: Secondary | ICD-10-CM

## 2019-04-06 LAB — CBC
HCT: 31.6 % — ABNORMAL LOW (ref 39.0–52.0)
Hemoglobin: 10.7 g/dL — ABNORMAL LOW (ref 13.0–17.0)
MCH: 32 pg (ref 26.0–34.0)
MCHC: 33.9 g/dL (ref 30.0–36.0)
MCV: 94.6 fL (ref 80.0–100.0)
Platelets: 261 10*3/uL (ref 150–400)
RBC: 3.34 MIL/uL — ABNORMAL LOW (ref 4.22–5.81)
RDW: 16 % — ABNORMAL HIGH (ref 11.5–15.5)
WBC: 6.2 10*3/uL (ref 4.0–10.5)
nRBC: 0 % (ref 0.0–0.2)

## 2019-04-06 LAB — COMPREHENSIVE METABOLIC PANEL
ALT: 49 U/L — ABNORMAL HIGH (ref 0–44)
AST: 37 U/L (ref 15–41)
Albumin: 1.8 g/dL — ABNORMAL LOW (ref 3.5–5.0)
Alkaline Phosphatase: 77 U/L (ref 38–126)
Anion gap: 11 (ref 5–15)
BUN: <5 mg/dL — ABNORMAL LOW (ref 6–20)
CO2: 25 mmol/L (ref 22–32)
Calcium: 7.8 mg/dL — ABNORMAL LOW (ref 8.9–10.3)
Chloride: 96 mmol/L — ABNORMAL LOW (ref 98–111)
Creatinine, Ser: 0.67 mg/dL (ref 0.61–1.24)
GFR calc Af Amer: >60 mL/min (ref >60)
GFR calc non Af Amer: >60 mL/min (ref >60)
Glucose, Bld: 139 mg/dL — ABNORMAL HIGH (ref 70–99)
Potassium: 3.2 mmol/L — ABNORMAL LOW (ref 3.5–5.1)
Sodium: 132 mmol/L — ABNORMAL LOW (ref 135–145)
Total Bilirubin: 1 mg/dL (ref 0.3–1.2)
Total Protein: 5.4 g/dL — ABNORMAL LOW (ref 6.5–8.1)

## 2019-04-06 LAB — GLUCOSE, CAPILLARY: Glucose-Capillary: 114 mg/dL — ABNORMAL HIGH (ref 70–99)

## 2019-04-06 MED ORDER — POTASSIUM CHLORIDE CRYS ER 20 MEQ PO TBCR
40.0000 meq | EXTENDED_RELEASE_TABLET | Freq: Once | ORAL | Status: AC
  Administered 2019-04-06: 40 meq via ORAL
  Filled 2019-04-06: qty 2

## 2019-04-06 MED ORDER — AMOXICILLIN-POT CLAVULANATE 875-125 MG PO TABS
1.0000 | ORAL_TABLET | Freq: Two times a day (BID) | ORAL | Status: DC
  Administered 2019-04-06 – 2019-04-07 (×3): 1 via ORAL
  Filled 2019-04-06 (×3): qty 1

## 2019-04-06 NOTE — Progress Notes (Addendum)
Daily Rounding Note  04/06/2019, 11:08 AM  LOS: 3 days   SUBJECTIVE:   Chief complaint:  Acute (alcoholic) pancreatitis   Patient feels better.  Has a little bit of lingering discomfort in the right upper quadrant.  Stools at baseline, had several loose, brown stools yesterday and 3 or 4 so far today.  Diet was advanced to soft by hospitalist this morning.  OBJECTIVE:         Vital signs in last 24 hours:    Temp:  [98 F (36.7 C)-98.8 F (37.1 C)] 98.5 F (36.9 C) (12/01 0734) Pulse Rate:  [101-117] 101 (12/01 0734) Resp:  [16-22] 18 (12/01 0734) BP: (122-132)/(92-103) 122/94 (12/01 0734) SpO2:  [94 %-100 %] 100 % (12/01 0734) Last BM Date: 04/06/19 Filed Weights   04/03/19 1630 04/04/19 0101  Weight: 85.7 kg 89.7 kg   General: Looks well.  Comfortable.  Alert. Heart: Regular.  Slightly tachycardic. Chest: Clear bilaterally.  No labored breathing or cough Abdomen: Soft.  Not tender, not distended.  Active bowel sounds. Extremities: No CCE. Neuro/Psych: Alert.  Oriented x3.  No tremors.  No gross weakness/deficits.  Intake/Output from previous day: 11/30 0701 - 12/01 0700 In: 3532 [P.O.:120; I.V.:750; IV Piggyback:300] Out: -   Intake/Output this shift: No intake/output data recorded.  Lab Results: Recent Labs    04/03/19 1748 04/05/19 0313 04/06/19 0331  WBC 11.0* 6.8 6.2  HGB 13.4 12.2* 10.7*  HCT 38.9* 35.8* 31.6*  PLT 159 218 261   BMET Recent Labs    04/04/19 1051 04/05/19 0313 04/06/19 0331  NA 128* 132* 132*  K 3.3* 3.3* 3.2*  CL 93* 95* 96*  CO2 24 25 25   GLUCOSE 135* 117* 139*  BUN <5* <5* <5*  CREATININE 0.57* 0.69 0.67  CALCIUM 7.7* 7.9* 7.8*   LFT Recent Labs    04/04/19 0302 04/05/19 0313 04/06/19 0331  PROT 5.8* 6.0* 5.4*  ALBUMIN 2.2* 2.1* 1.8*  AST 92* 65* 37  ALT 87* 74* 49*  ALKPHOS 85 85 77  BILITOT 1.9* 1.2 1.0  BILIDIR 0.7*  --   --   IBILI 1.2*  --   --    PT/INR No results for input(s): LABPROT, INR in the last 72 hours. Hepatitis Panel No results for input(s): HEPBSAG, HCVAB, HEPAIGM, HEPBIGM in the last 72 hours.  Studies/Results: No results found.   Scheduled Meds: . enoxaparin (LOVENOX) injection  40 mg Subcutaneous Q24H  . folic acid  1 mg Oral Daily  . LORazepam  0-4 mg Intravenous Q8H  . multivitamin with minerals  1 tablet Oral Daily  . pantoprazole  40 mg Oral Q0600  . sodium chloride flush  3 mL Intravenous Q12H  . thiamine  100 mg Oral Daily   Or  . thiamine  100 mg Intravenous Daily   Continuous Infusions: . 0.9 % NaCl with KCl 40 mEq / L 75 mL/hr at 04/06/19 0641  . cefTRIAXone (ROCEPHIN)  IV 2 g (04/05/19 2054)  . metronidazole 500 mg (04/06/19 0530)   PRN Meds:.acetaminophen **OR** acetaminophen, LORazepam **OR** LORazepam, morphine injection, ondansetron (ZOFRAN) IV, oxyCODONE   ASSESMENT:   *   Acute pancreatitis, presumed ETOH etiology.  GB sludge and pneumobilia on CT.  Day 3 empiric abx rocephin, Flagyl.    *   Longstanding UC, not well controlled on q 2 week Humira.  Humira increased from bi weekly to weekly in 03/2019.  He missed his dose  this past Friday 11/27.  Followed at [facility] Dr Georgina Quint and Dr [name] in [facility].        PLAN   *    Has 12/7 ROV w Dr Dema Severin.    *   Resume weekly Humira.  Get his next dose in as soon as he gets home which may be today or tomorrow and weekly thereafter.  *     GI signing off.  Need to define duration of antibiotics.  At discharge should we go with Augmentin if outpatient antibiotics needed?,  Will discuss with Dr. Havery Moros.       Azucena Freed  04/06/2019, 11:08 AM Phone [phone]

## 2019-04-06 NOTE — Progress Notes (Signed)
PROGRESS NOTE    Charles Hardy  UTM:[id number] DOB: March 06, 1976 DOA: 04/03/2019 PCP: Patient, No Pcp Per  Brief Narrative: Charles Hardy is a 43 y.o. male with medical history significant for ulcerative colitis and alcohol dependence, presented to the emergency department for evaluation of abdominal pain.  Patient reports approximately 1 week of progressive abdominal pain with loss of appetite. -He recently established care with gastroenterology at [facility] for ulcerative colitis and was treated with Humira, last dose was about 10 days ago -In the emergency room he was noted to be afebrile tachycardic, he had a transient episode of A. fib RVR, subsequently converted to sinus tachycardia, CT abdomen pelvis was concerning for acute pancreatitis with pneumobilia, also noted to have mildly elevated AST ALT and a bili of 2.1 -Gastroenterology was consulted overnight  Assessment & Plan:  Acute alcoholic pancreatitis, pneumobilia -long-term history of alcohol abuse, last 2 weeks was drinking heavily 12 pack a day -Given concern for pneumobilia on CT abdomen pelvis, right upper quadrant ultrasound was negative for gallstones, no evidence of cholecystitis -Remains on empiric ceftriaxone and Flagyl due to initial concern for questionable pneumobilia, completed 3 days of IV antibiotics, will transition to oral Augmentin today to complete 7 to 10-day course of antibiotics -Advance to soft diet, cutdown IV fluids -Ambulate, discharge planning -Appreciate GI input  Transient A. fib RVR -Resolved, now with sinus tachycardia likely in the setting of acute catecholamine surge -Now in sinus tachycardia which could be from mild withdrawal, monitor  Alcohol dependence -Heavy abuse recently, at high risk of withdrawal, went into the EtOH withdrawal during his recent admission -Continue thiamine, monitor with CIWA protocol -Counseled  Alcoholic hepatic steatosis -Counseled  Hyponatremia -Improving with  hydration  Hypokalemia -Continue replacement, improving  Ulcerative colitis -Followed by gastroenterology at [place] approximately 10 days ago  DVT prophylaxis: Lovenox Code Status: Full code Family Communication: No family at bedside Disposition Plan: Home tomorrow if stable  Consultants:   Gastroenterology   Procedures:   Antimicrobials:    Subjective: -Feels okay, minimal discomfort only, tolerating clear liquids -Denies any withdrawal symptoms, continues to have loose stools, close to his baseline with ulcerative colitis  Objective: Vitals:   04/06/19 0400 04/06/19 0412 04/06/19 0446 04/06/19 0734  BP:  (!) 131/99  (!) 122/94  Pulse:  (!) 110  (!) 101  Resp: (!) 22 20 16 18   Temp:  98 F (36.7 C)  98.5 F (36.9 C)  TempSrc:  Oral  Oral  SpO2:  96%  100%  Weight:      Height:        Intake/Output Summary (Last 24 hours) at 04/06/2019 1324 Last data filed at 04/06/2019 0641 Gross per 24 hour  Intake 1050 ml  Output -  Net 1050 ml   Filed Weights   04/03/19 1630 04/04/19 0101  Weight: 85.7 kg 89.7 kg    Examination:  Gen: Averagely built male sitting up in bed AAOx3, no distress HEENT: PERRLA, Neck supple, no JVD Lungs: Clear  CVS: Regular rhythm, mild tachycardia Abd: Soft, mild epigastric tenderness, significantly improved, nondistended, bowel sounds present Extremities: No edema Skin: no new rashes Neuro, no asterixis, no tremors Psychiatry: Judgement and insight appear normal, no tremors today    Data Reviewed:   CBC: Recent Labs  Lab 04/03/19 1748 04/05/19 0313 04/06/19 0331  WBC 11.0* 6.8 6.2  NEUTROABS 9.4*  --   --   HGB 13.4 12.2* 10.7*  HCT 38.9* 35.8* 31.6*  MCV  90.5 93.0 94.6  PLT 159 218 295   Basic Metabolic Panel: Recent Labs  Lab 04/03/19 2253 04/04/19 0302 04/04/19 1051 04/05/19 0313 04/06/19 0331  NA 130* 130* 128* 132* 132*  K 2.9* 3.3* 3.3* 3.3* 3.2*  CL 89* 94* 93* 95* 96*  CO2 22 23 24 25  25   GLUCOSE 107* 120* 135* 117* 139*  BUN <5* <5* <5* <5* <5*  CREATININE 0.82 0.66 0.57* 0.69 0.67  CALCIUM 7.4* 7.4* 7.7* 7.9* 7.8*  MG  --  1.9  --   --   --   PHOS  --  2.3*  --   --   --    GFR: Estimated Creatinine Clearance: 128.1 mL/min (by C-G formula based on SCr of 0.67 mg/dL). Liver Function Tests: Recent Labs  Lab 04/03/19 1748 04/04/19 0302 04/05/19 0313 04/06/19 0331  AST 114* 92* 65* 37  ALT 101* 87* 74* 49*  ALKPHOS 108 85 85 77  BILITOT 2.1* 1.9* 1.2 1.0  PROT 6.7 5.8* 6.0* 5.4*  ALBUMIN 2.5* 2.2* 2.1* 1.8*   Recent Labs  Lab 04/03/19 1748 04/05/19 0313  LIPASE 38 19   No results for input(s): AMMONIA in the last 168 hours. Coagulation Profile: No results for input(s): INR, PROTIME in the last 168 hours. Cardiac Enzymes: No results for input(s): CKTOTAL, CKMB, CKMBINDEX, TROPONINI in the last 168 hours. BNP (last 3 results) No results for input(s): PROBNP in the last 8760 hours. HbA1C: No results for input(s): HGBA1C in the last 72 hours. CBG: Recent Labs  Lab 04/04/19 0611 04/05/19 0615 04/06/19 0621  GLUCAP 135* 122* 114*   Lipid Profile: No results for input(s): CHOL, HDL, LDLCALC, TRIG, CHOLHDL, LDLDIRECT in the last 72 hours. Thyroid Function Tests: Recent Labs    04/04/19 0302  TSH 0.736   Anemia Panel: No results for input(s): VITAMINB12, FOLATE, FERRITIN, TIBC, IRON, RETICCTPCT in the last 72 hours. Urine analysis:    Component Value Date/Time   COLORURINE AMBER (A) 04/03/2019 1850   APPEARANCEUR CLEAR 04/03/2019 1850   LABSPEC 1.011 04/03/2019 1850   PHURINE 6.0 04/03/2019 1850   GLUCOSEU NEGATIVE 04/03/2019 1850   HGBUR NEGATIVE 04/03/2019 [place] 04/03/2019 1850   KETONESUR 80 (A) 04/03/2019 1850   PROTEINUR 30 (A) 04/03/2019 1850   NITRITE NEGATIVE 04/03/2019 1850   LEUKOCYTESUR NEGATIVE 04/03/2019 1850   Sepsis Labs: @LABRCNTIP (procalcitonin:4,lacticidven:4)  ) Recent Results (from the past  240 hour(s))  Blood culture (routine x 2)     Status: None (Preliminary result)   Collection Time: 04/03/19  7:52 PM   Specimen: BLOOD RIGHT HAND  Result Value Ref Range Status   Specimen Description BLOOD RIGHT HAND  Final   Special Requests   Final    BOTTLES DRAWN AEROBIC AND ANAEROBIC Blood Culture adequate volume   Culture   Final    NO GROWTH 3 DAYS Performed at [facility], [place] [address], [place], [place] [address]    Report Status PENDING  Incomplete  Blood culture (routine x 2)     Status: None (Preliminary result)   Collection Time: 04/03/19  8:13 PM   Specimen: BLOOD  Result Value Ref Range Status   Specimen Description BLOOD LEFT ANTECUBITAL  Final   Special Requests   Final    BOTTLES DRAWN AEROBIC AND ANAEROBIC Blood Culture adequate volume   Culture   Final    NO GROWTH 3 DAYS Performed at [facility], [place],  [place] [address]    Report Status PENDING  Incomplete  SARS CORONAVIRUS 2 (TAT 6-24 HRS) Nasopharyngeal Nasopharyngeal Swab     Status: None   Collection Time: 04/03/19  9:41 PM   Specimen: Nasopharyngeal Swab  Result Value Ref Range Status   SARS Coronavirus 2 NEGATIVE NEGATIVE Final    Comment: (NOTE) SARS-CoV-2 target nucleic acids are NOT DETECTED. The SARS-CoV-2 RNA is generally detectable in upper and lower respiratory specimens during the acute phase of infection. Negative results do not preclude SARS-CoV-2 infection, do not rule out co-infections with other pathogens, and should not be used as the sole basis for treatment or other patient management decisions. Negative results must be combined with clinical observations, patient history, and epidemiological information. The expected result is Negative. Fact Sheet for Patients: SugarRoll.be Fact Sheet for Healthcare Providers: https://www.woods-mathews.com/ This test is not yet approved or cleared by the [place] FDA  and  has been authorized for detection and/or diagnosis of SARS-CoV-2 by FDA under an Emergency Use Authorization (EUA). This EUA will remain  in effect (meaning this test can be used) for the duration of the COVID-19 declaration under Section 56 4(b)(1) of the Act, 21 U.S.C. section 360bbb-3(b)(1), unless the authorization is terminated or revoked sooner. Performed at [facility], [place] [address] [place], [place] [address]          Radiology Studies: No results found.      Scheduled Meds: . enoxaparin (LOVENOX) injection  40 mg Subcutaneous Q24H  . folic acid  1 mg Oral Daily  . LORazepam  0-4 mg Intravenous Q8H  . multivitamin with minerals  1 tablet Oral Daily  . pantoprazole  40 mg Oral Q0600  . sodium chloride flush  3 mL Intravenous Q12H  . thiamine  100 mg Oral Daily   Or  . thiamine  100 mg Intravenous Daily   Continuous Infusions: . 0.9 % NaCl with KCl 40 mEq / L 75 mL/hr at 04/06/19 0641  . cefTRIAXone (ROCEPHIN)  IV 2 g (04/05/19 2054)  . metronidazole 500 mg (04/06/19 0530)     LOS: 3 days   Time spent: 64mn  PDomenic Polite MD [facility]  04/06/2019, 1:24 PM

## 2019-04-07 DIAGNOSIS — I4891 Unspecified atrial fibrillation: Secondary | ICD-10-CM

## 2019-04-07 DIAGNOSIS — E876 Hypokalemia: Secondary | ICD-10-CM

## 2019-04-07 DIAGNOSIS — E871 Hypo-osmolality and hyponatremia: Secondary | ICD-10-CM

## 2019-04-07 LAB — GLUCOSE, CAPILLARY: Glucose-Capillary: 124 mg/dL — ABNORMAL HIGH (ref 70–99)

## 2019-04-07 LAB — COMPREHENSIVE METABOLIC PANEL
ALT: 36 U/L (ref 0–44)
AST: 26 U/L (ref 15–41)
Albumin: 1.8 g/dL — ABNORMAL LOW (ref 3.5–5.0)
Alkaline Phosphatase: 70 U/L (ref 38–126)
Anion gap: 8 (ref 5–15)
BUN: <5 mg/dL — ABNORMAL LOW (ref 6–20)
CO2: 25 mmol/L (ref 22–32)
Calcium: 7.9 mg/dL — ABNORMAL LOW (ref 8.9–10.3)
Chloride: 98 mmol/L (ref 98–111)
Creatinine, Ser: 0.66 mg/dL (ref 0.61–1.24)
GFR calc Af Amer: >60 mL/min (ref >60)
GFR calc non Af Amer: >60 mL/min (ref >60)
Glucose, Bld: 148 mg/dL — ABNORMAL HIGH (ref 70–99)
Potassium: 3.9 mmol/L (ref 3.5–5.1)
Sodium: 131 mmol/L — ABNORMAL LOW (ref 135–145)
Total Bilirubin: 0.8 mg/dL (ref 0.3–1.2)
Total Protein: 5.4 g/dL — ABNORMAL LOW (ref 6.5–8.1)

## 2019-04-07 LAB — CBC
HCT: 31.9 % — ABNORMAL LOW (ref 39.0–52.0)
Hemoglobin: 10.5 g/dL — ABNORMAL LOW (ref 13.0–17.0)
MCH: 31.3 pg (ref 26.0–34.0)
MCHC: 32.9 g/dL (ref 30.0–36.0)
MCV: 94.9 fL (ref 80.0–100.0)
Platelets: 327 10*3/uL (ref 150–400)
RBC: 3.36 MIL/uL — ABNORMAL LOW (ref 4.22–5.81)
RDW: 15.7 % — ABNORMAL HIGH (ref 11.5–15.5)
WBC: 6.8 10*3/uL (ref 4.0–10.5)
nRBC: 0 % (ref 0.0–0.2)

## 2019-04-07 MED ORDER — AMOXICILLIN-POT CLAVULANATE 875-125 MG PO TABS: 1.0000 | ORAL_TABLET | Freq: Two times a day (BID) | ORAL | 0 refills | Status: AC

## 2019-04-07 MED ORDER — ACETAMINOPHEN 325 MG PO TABS: 650.0000 mg | ORAL_TABLET | Freq: Four times a day (QID) | ORAL | 0 refills | Status: DC | PRN

## 2019-04-07 MED ORDER — OXYCODONE HCL 5 MG PO TABS: 5.0000 mg | ORAL_TABLET | Freq: Four times a day (QID) | ORAL | 0 refills | Status: AC | PRN

## 2019-04-07 NOTE — Discharge Summary (Signed)
Physician Discharge Summary [facility]    Patient: Charles Hardy                   Admit date: 04/03/2019   DOB: 24-Feb-1976             Discharge date:04/07/2019/10:46 AM FOY:[id number]                          PCP: Patient, No Pcp Per  Disposition: HOME  Recommendations for Outpatient Follow-up:   . Follow up: in 2 weeks  Discharge Condition: Stable   Code Status:   Code Status: Full Code  Diet recommendation: Cardiac diet   Discharge Diagnoses:    Principal Problem:   Acute pancreatitis Active Problems:   Ulcerative colitis ([place])   Pneumobilia   Hyponatremia   Hypokalemia   Alcohol abuse   Atrial fibrillation with RVR ([place])   History of Present Illness/ Hospital Course Charles Hardy Summary:   Charles Hardy a 43 y.o.malewith medical history significant forulcerative colitis and alcohol dependence, presented to the emergency department for evaluation of abdominal pain. Patient reports approximately 1 week of progressive abdominal pain with loss of appetite. -He recently established care with gastroenterology at [facility] for ulcerative colitis and was treated with Humira, last dose was about 10 days ago -In the emergency room he was noted to be afebrile tachycardic, he had a transient episode of A. fib RVR, subsequently converted to sinus tachycardia, CT abdomen pelvis was concerning for acute pancreatitis with pneumobilia, also noted to have mildly elevated AST ALT and a bili of 2.1 -Gastroenterology was consulted --- cont. Humira and f/up signed off primary GI at [facility], he is scheduled to see them next week.    D/C summery:  Acute alcoholic pancreatitis, pneumobilia -long-term history of alcohol abuse, last 2 weeks was drinking heavily 12 pack a day -Given concern for pneumobilia on CT abdomen pelvis, right upper quadrant ultrasound was negative for gallstones, no evidence of cholecystitis -Remains on empiric ceftriaxone and Flagyl due to initial  concern for questionable pneumobilia, completed 3 days of IV antibiotics,  Was transition to oral Augmentin for 7 more days to complete 10-day course of antibiotics -Advancing diet, S/p  IV fluids -Ambulate, discharging today  -Appreciate GI - seen and evaluated - to cont. Humira and f/up signed off primary GI at [facility], he is scheduled to see them next week.    Transient A. fib RVR -Resolved, very brief, has been in NRS -sinus tachycardia likely in the setting of acute catecholamine surge -tachycardia which could be from mild withdrawal, monitor  Alcohol dependence -Heavy abuse recently, at high risk of withdrawal, went into the EtOH withdrawal during his recent admission -Continue thiamine, monitor with CIWA protocol -Counseled  Alcoholic hepatic steatosis -Counseled  Hyponatremia -Improving with hydration  Hypokalemia -Continue replacement, improving  Ulcerative colitis -Followed by gastroenterology at [place] -On  Humira    Code Status: Full code Family Communication: Charles Hardy at bedside Disposition Plan: Stable to D/[place]ome     Discharge Instructions:   Discharge Instructions    Activity as tolerated - No restrictions   Complete by: As directed    Call MD for:  difficulty breathing, headache or visual disturbances   Complete by: As directed    Call MD for:  persistant nausea and vomiting   Complete by: As directed    Diet - low sodium heart healthy   Complete by: As directed    Discharge  instructions   Complete by: As directed    F/up with you GI in 2 wks   Increase activity slowly   Complete by: As directed        Medication List    STOP taking these medications   omeprazole 40 MG capsule Commonly known as: PRILOSEC   predniSONE 10 MG tablet Commonly known as: DELTASONE     TAKE these medications   acetaminophen 325 MG tablet Commonly known as: TYLENOL Take 2 tablets (650 mg total) by mouth every 6 (six) hours as needed for mild pain (or Fever  >/= 101).   amoxicillin-clavulanate 875-125 MG tablet Commonly known as: AUGMENTIN Take 1 tablet by mouth every 12 (twelve) hours for 7 days.   cholecalciferol 25 MCG (1000 UT) tablet Commonly known as: VITAMIN D3 Take 1,000 Units by mouth daily.   docusate sodium 100 MG capsule Commonly known as: COLACE Take 100 mg by mouth daily as needed for mild constipation.   Humira 40 MG/0.4ML Pskt Generic drug: Adalimumab Inject 40 mg into the skin once a week.   Icy Hot Extra Strength 10-30 % Crea Apply 1 application topically as needed (back pain).   multivitamin capsule Take 1 capsule by mouth daily.   oxyCODONE 5 MG immediate release tablet Commonly known as: Oxy IR/ROXICODONE Take 1 tablet (5 mg total) by mouth every 6 (six) hours as needed for up to 5 days for severe pain.       Allergies  Allergen Reactions  . Bee Venom Anaphylaxis     Procedures /Studies:   Ct Abdomen Pelvis W Contrast  Result Date: 04/03/2019 CLINICAL DATA:  Generalized abdominal pain. History of ulcerative colitis. EXAM: CT ABDOMEN AND PELVIS WITH CONTRAST TECHNIQUE: Multidetector CT imaging of the abdomen and pelvis was performed using the standard protocol following bolus administration of intravenous contrast. CONTRAST:  173m OMNIPAQUE IOHEXOL 300 MG/ML  SOLN COMPARISON:  CT abdomen pelvis 02/11/2019 FINDINGS: Lower chest: Normal heart size. Minimal dependent atelectasis within the lung bases bilaterally. Hepatobiliary: Liver is diffusely low in attenuation compatible with steatosis. Gas within the gallbladder lumen and within the left hepatic lobe bile ducts centrally. Pancreas: The pancreas is diffusely thickened and edematous with peripancreatic fluid and fat stranding. Spleen: Unremarkable Adrenals/Urinary Tract: Normal adrenal glands. Kidneys enhance symmetrically with contrast. No hydronephrosis. Urinary bladder is unremarkable. High attenuation within the distal left ureter is favored to  represent contrast material. Stomach/Bowel: There is mild wall thickening of the cecum and ascending colon. No evidence for small bowel obstruction. No free intraperitoneal air. Normal morphology of the stomach. Vascular/Lymphatic: Normal caliber abdominal aorta. No retroperitoneal lymphadenopathy. Reproductive: Unremarkable Other: Small fat containing left inguinal hernia. Musculoskeletal: No aggressive or acute appearing osseous lesions. Lumbar spine degenerative changes. IMPRESSION: 1. Findings compatible with acute pancreatitis. 2. Interval development of pneumobilia and small amount of gas within the gallbladder lumen. Recommend correlation for sphincterotomy. 3. Mild wall thickening of the cecum and ascending colon compatible with history of ulcerative colitis. 4. Hepatic steatosis. Electronically Signed   By: DLovey NewcomerM.D.   On: 04/03/2019 20:48   Dg Foot 2 Views Right  Result Date: 04/03/2019 CLINICAL DATA:  Pain about the dorsum of the right foot for 1 week. No known injury. EXAM: RIGHT FOOT - 2 VIEW COMPARISON:  None. FINDINGS: There is no evidence of fracture or dislocation. There is no evidence of arthropathy or other focal bone abnormality. Soft tissues are unremarkable. IMPRESSION: Negative exam. Electronically Signed   By: TMarcello Moores  Dalessio M.D.   On: 04/03/2019 17:35   US Abdomen Limited Ruq  Result Date: 04/04/2019 CLINICAL DATA:  Pancreatitis. EXAM: ULTRASOUND ABDOMEN LIMITED RIGHT UPPER QUADRANT COMPARISON:  CT abdomen and pelvis 04/03/2019 FINDINGS: Gallbladder: Large volume gallbladder sludge without gallstones visualized. Small volume gas in the gallbladder as seen on CT. Borderline gallbladder wall thickening with 6 mm measurement felt to reflect an over estimation due to inclusion of trace pericholecystic fluid. No sonographic Murphy sign noted by sonographer. Common bile duct: Diameter: Within normal limits at 5 mm though overall suboptimally visualized Liver: Diffusely increased  parenchymal echogenicity without focal lesion identified. Portal vein is patent on color Doppler imaging with normal direction of blood flow towards the liver. Other: None. IMPRESSION: 1. Gallbladder sludge without gallstones identified. Trace pericholecystic fluid and borderline gallbladder wall thickening may be related to regional inflammation from pancreatitis. No sonographic Murphy sign to specifically suggest cholecystitis. 2. Hepatic steatosis. Electronically Signed   By: Logan Bores M.D.   On: 04/04/2019 08:38     Subjective:   Patient was seen and examined 04/07/2019, 10:46 AM Patient stable today. No acute distress.  No issues overnight Stable for discharge.  Discharge Exam:    Vitals:   04/06/19 0734 04/06/19 2000 04/07/19 0431 04/07/19 0900  BP: (!) 122/94 (!) 125/98 (!) 121/97 (!) 134/104  Pulse: (!) 101 96 90 99  Resp: 18 20 19    Temp: 98.5 F (36.9 C) 98.6 F (37 C) 97.9 F (36.6 C)   TempSrc: Oral Oral Oral   SpO2: 100% 100% 100%   Weight:      Height:        General: Pt lying comfortably in bed & appears in no obvious distress. Cardiovascular: S1 & S2 heard, RRR, S1/S2 +. No murmurs, rubs, gallops or clicks. No JVD or pedal edema. Respiratory: Clear to auscultation without wheezing, rhonchi or crackles. No increased work of breathing. Abdominal:  Non-distended, non-tender & soft. No organomegaly or masses appreciated. Normal bowel sounds heard. CNS: Alert and oriented. No focal deficits. Extremities: no edema, no cyanosis    The results of significant diagnostics from this hospitalization (including imaging, microbiology, ancillary and laboratory) are listed below for reference.      Microbiology:   Recent Results (from the past 240 hour(s))  Blood culture (routine x 2)     Status: None (Preliminary result)   Collection Time: 04/03/19  7:52 PM   Specimen: BLOOD RIGHT HAND  Result Value Ref Range Status   Specimen Description BLOOD RIGHT HAND  Final    Special Requests   Final    BOTTLES DRAWN AEROBIC AND ANAEROBIC Blood Culture adequate volume   Culture   Final    NO GROWTH 4 DAYS Performed at [facility], [place] [address] [place], [place] [address]    Report Status PENDING  Incomplete  Blood culture (routine x 2)     Status: None (Preliminary result)   Collection Time: 04/03/19  8:13 PM   Specimen: BLOOD  Result Value Ref Range Status   Specimen Description BLOOD LEFT ANTECUBITAL  Final   Special Requests   Final    BOTTLES DRAWN AEROBIC AND ANAEROBIC Blood Culture adequate volume   Culture   Final    NO GROWTH 4 DAYS Performed at [facility], [place] [address], [place], [place] [address]    Report Status PENDING  Incomplete  SARS CORONAVIRUS 2 (TAT 6-24 HRS) Nasopharyngeal Nasopharyngeal Swab     Status: None  Collection Time: 04/03/19  9:41 PM   Specimen: Nasopharyngeal Swab  Result Value Ref Range Status   SARS Coronavirus 2 NEGATIVE NEGATIVE Final    Comment: (NOTE) SARS-CoV-2 target nucleic acids are NOT DETECTED. The SARS-CoV-2 RNA is generally detectable in upper and lower respiratory specimens during the acute phase of infection. Negative results do not preclude SARS-CoV-2 infection, do not rule out co-infections with other pathogens, and should not be used as the sole basis for treatment or other patient management decisions. Negative results must be combined with clinical observations, patient history, and epidemiological information. The expected result is Negative. Fact Sheet for Patients: SugarRoll.be Fact Sheet for Healthcare Providers: https://www.woods-mathews.com/ This test is not yet approved or cleared by the [place] FDA and  has been authorized for detection and/or diagnosis of SARS-CoV-2 by FDA under an Emergency Use Authorization (EUA). This EUA will remain  in effect (meaning this test can be used) for the duration of the COVID-19 declaration  under Section 56 4(b)(1) of the Act, 21 U.S.C. section 360bbb-3(b)(1), unless the authorization is terminated or revoked sooner. Performed at [facility], [place] [address] [place], [place] [address]      Labs:   CBC: Recent Labs  Lab 04/03/19 1748 04/05/19 0313 04/06/19 0331 04/07/19 0310  WBC 11.0* 6.8 6.2 6.8  NEUTROABS 9.4*  --   --   --   HGB 13.4 12.2* 10.7* 10.5*  HCT 38.9* 35.8* 31.6* 31.9*  MCV 90.5 93.0 94.6 94.9  PLT 159 218 261 284   Basic Metabolic Panel: Recent Labs  Lab 04/04/19 0302 04/04/19 1051 04/05/19 0313 04/06/19 0331 04/07/19 0310  NA 130* 128* 132* 132* 131*  K 3.3* 3.3* 3.3* 3.2* 3.9  CL 94* 93* 95* 96* 98  CO2 23 24 25 25 25   GLUCOSE 120* 135* 117* 139* 148*  BUN <5* <5* <5* <5* <5*  CREATININE 0.66 0.57* 0.69 0.67 0.66  CALCIUM 7.4* 7.7* 7.9* 7.8* 7.9*  MG 1.9  --   --   --   --   PHOS 2.3*  --   --   --   --    Liver Function Tests: Recent Labs  Lab 04/03/19 1748 04/04/19 0302 04/05/19 0313 04/06/19 0331 04/07/19 0310  AST 114* 92* 65* 37 26  ALT 101* 87* 74* 49* 36  ALKPHOS 108 85 85 77 70  BILITOT 2.1* 1.9* 1.2 1.0 0.8  PROT 6.7 5.8* 6.0* 5.4* 5.4*  ALBUMIN 2.5* 2.2* 2.1* 1.8* 1.8*   BNP (last 3 results) No results for input(s): BNP in the last 8760 hours. Cardiac Enzymes: No results for input(s): CKTOTAL, CKMB, CKMBINDEX, TROPONINI in the last 168 hours. CBG: Recent Labs  Lab 04/04/19 0611 04/05/19 0615 04/06/19 0621 04/07/19 0624  GLUCAP 135* 122* 114* 124*  Urinalysis    Component Value Date/Time   COLORURINE AMBER (A) 04/03/2019 1850   APPEARANCEUR CLEAR 04/03/2019 1850   LABSPEC 1.011 04/03/2019 1850   PHURINE 6.0 04/03/2019 1850   GLUCOSEU NEGATIVE 04/03/2019 1850   HGBUR NEGATIVE 04/03/2019 1850   BILIRUBINUR NEGATIVE 04/03/2019 1850   KETONESUR 80 (A) 04/03/2019 1850   PROTEINUR 30 (A) 04/03/2019 1850   NITRITE NEGATIVE 04/03/2019 1850   LEUKOCYTESUR NEGATIVE 04/03/2019 1850          Time coordinating discharge: Over 45 minutes  SIGNED: Deatra James, MD, FACP, FHM. [facility],  Pager [phone]  If 7PM-7AM, please contact night-coverage [email or web address] Ota [facility] 04/07/2019, 10:46 AM

## 2019-04-07 NOTE — Progress Notes (Signed)
Discharge instructions given to patient. Iv removed, clean and intact. Medications reviewed. All questions answered. Patient escorted home by wife.   Arletta Bale, RN

## 2019-04-07 NOTE — Progress Notes (Signed)
Nutrition Education Note  RD consulted for nutrition education  Reviewed patient's dietary recall. Provided examples on ways to decrease fat intake.Marland Kitchen Discussed with patient the importance of having adequate amounts of lean protein and avoiding saturated fats. Pt reports changing his diet years ago due to new UC diagnosis. Typically avoids fresh vegetables (roughage), fresh fruits, and dairy. Encouraged pt to continue current practices that prevent flare ups. Discouraged alcohol intake.   Teach back method used.  Expect fair compliance.  Body mass index is 26.9 kg/m. Pt meets criteria for overweight based on current BMI.  Current diet order is low sodium.  Labs and medications reviewed. No further nutrition interventions warranted at this time. RD contact information provided. If additional nutrition issues arise, please re-consult RD.  Mariana Single RD, LDN Clinical Nutrition Pager # [phone]

## 2019-04-08 ENCOUNTER — Ambulatory Visit (INDEPENDENT_AMBULATORY_CARE_PROVIDER_SITE_OTHER): Payer: BC Managed Care – PPO | Admitting: Family Medicine

## 2019-04-08 DIAGNOSIS — F419 Anxiety disorder, unspecified: Secondary | ICD-10-CM | POA: Diagnosis not present

## 2019-04-08 DIAGNOSIS — F1010 Alcohol abuse, uncomplicated: Secondary | ICD-10-CM

## 2019-04-08 DIAGNOSIS — K5180 Other ulcerative colitis without complications: Secondary | ICD-10-CM | POA: Diagnosis not present

## 2019-04-08 DIAGNOSIS — K859 Acute pancreatitis without necrosis or infection, unspecified: Secondary | ICD-10-CM

## 2019-04-08 LAB — CULTURE, BLOOD (ROUTINE X 2)
Culture: NO GROWTH
Culture: NO GROWTH
Special Requests: ADEQUATE
Special Requests: ADEQUATE

## 2019-04-08 MED ORDER — VENLAFAXINE HCL ER 37.5 MG PO CP24: 37.5000 mg | ORAL_CAPSULE | Freq: Every day | ORAL | 3 refills | Status: DC

## 2019-04-08 MED ORDER — FOLIC ACID 1 MG PO TABS: 1.0000 mg | ORAL_TABLET | Freq: Every day | ORAL | 6 refills | Status: DC

## 2019-04-08 MED ORDER — THIAMINE HCL 100 MG PO TABS: 100.0000 mg | ORAL_TABLET | Freq: Every day | ORAL | 6 refills | Status: DC

## 2019-04-08 NOTE — Progress Notes (Signed)
Virtual Visit via Telephone Note  I connected with Charles Hardy, on 04/08/2019 at 8:53 AM by telephone due to the COVID-19 pandemic and verified that I am speaking with the correct person using two identifiers.   Consent: I discussed the limitations, risks, security and privacy concerns of performing an evaluation and management service by telephone and the availability of in person appointments. I also discussed with the patient that there may be a patient responsible charge related to this service. The patient expressed understanding and agreed to proceed.   Location of Patient: Home  Location of Provider: Clinic   Persons participating in Telemedicine visit: Charles Hardy [facility] Dr. Margarita Rana     History of Present Illness: 43 year old male with a history of ulcerative colitis, anxiety, alcohol abuse seen today for follow-up from hospitalization at [facility] from 04/03/2019 through 04/07/2019 for acute pancreatitis.  On presentation his EKG revealed transient episode of A. fib with RVR which converted to sinus tachycardia.  Labs revealed elevated LFTs, CT abdomen and pelvis in keeping with acute pancreatitis,  Hepatic steatosis and pneumobilia. He was treated with IV fluids, IV antibiotics and maintained under CIWA protocol. 2 months ago he had a hospitalization for ulcerative colitis flare.  He has a little abdominal pain 3/10. Denies nausea or vomiting, appetite is good and restricted to a soft diet. Using Oxycodone periodically for pain. Appt with [facility] comes up next Wednesday and he is currently on Humira for his ulcerative colitis. At his last visit he had informed me he drank alcohol as medication for his pain.  He has not drank since discharge.  We had also discussed the issue of anxiety at his last visit with me 6 weeks ago but he had declined medication at that time. Requests Effexor for anxiety as it worked in his brother and other family  member. He is open to alcohol rehab.  Past Medical History:  Diagnosis Date  . Allergy   . Anxiety   . Colitis   . Ulcerative colitis ([place])    Allergies  Allergen Reactions  . Bee Venom Anaphylaxis    Current Outpatient Medications on File Prior to Visit  Medication Sig Dispense Refill  . acetaminophen (TYLENOL) 325 MG tablet Take 2 tablets (650 mg total) by mouth every 6 (six) hours as needed for mild pain (or Fever >/= 101). 30 tablet 0  . Adalimumab (HUMIRA) 40 MG/0.4ML PSKT Inject 40 mg into the skin once a week.     Marland Kitchen amoxicillin-clavulanate (AUGMENTIN) 875-125 MG tablet Take 1 tablet by mouth every 12 (twelve) hours for 7 days. 14 tablet 0  . cholecalciferol (VITAMIN D3) 25 MCG (1000 UT) tablet Take 1,000 Units by mouth daily.    Marland Kitchen docusate sodium (COLACE) 100 MG capsule Take 100 mg by mouth daily as needed for mild constipation.    . Multiple Vitamin (MULTIVITAMIN) capsule Take 1 capsule by mouth daily.    Marland Kitchen oxyCODONE (OXY IR/ROXICODONE) 5 MG immediate release tablet Take 1 tablet (5 mg total) by mouth every 6 (six) hours as needed for up to 5 days for severe pain. 15 tablet 0   No current facility-administered medications on file prior to visit.     Observations/Objective: Awake, alert, oriented x3 Not in acute distress  Assessment and Plan: 1. Anxiety He will benefit from counseling Referred to LCSW - venlafaxine XR (EFFEXOR XR) 37.5 MG 24 hr capsule; Take 1 capsule (37.5 mg total) by mouth daily with breakfast.  Dispense:  30 capsule; Refill: 3  2. Other ulcerative colitis without complication (HCC) No recent flare Currently on Humira Has upcoming appointment with [facility] next week  3. Acute pancreatitis, unspecified complication status, unspecified pancreatitis type Secondary to alcohol abuse Improving Advised against ongoing alcohol abuse  4. Alcohol abuse Counseled on alcohol cessation Referred to LCSW to assist with referral to alcohol rehab - folic  acid (FOLVITE) 1 MG tablet; Take 1 tablet (1 mg total) by mouth daily.  Dispense: 30 tablet; Refill: 6 - thiamine 100 MG tablet; Take 1 tablet (100 mg total) by mouth daily.  Dispense: 30 tablet; Refill: 6   Follow Up Instructions: 3 months   I discussed the assessment and treatment plan with the patient. The patient was provided an opportunity to ask questions and all were answered. The patient agreed with the plan and demonstrated an understanding of the instructions.   The patient was advised to call back or seek an in-person evaluation if the symptoms worsen or if the condition fails to improve as anticipated.     I provided 16 minutes total of non-face-to-face time during this encounter including median intraservice time, reviewing previous notes, labs, imaging, medications, management and patient verbalized understanding.     Charlott Rakes, MD, FAAFP. [facility] [place], [place]   04/08/2019, 8:53 AM

## 2019-04-08 NOTE — Progress Notes (Signed)
Hospital visit 11/28-12/2 for acute pancreatitis, alcohol abuse. Has GI follow up on 12/7

## 2019-04-09 ENCOUNTER — Other Ambulatory Visit: Payer: Self-pay

## 2019-04-09 ENCOUNTER — Ambulatory Visit: Payer: BC Managed Care – PPO | Attending: Family Medicine | Admitting: Licensed Clinical Social Worker

## 2019-04-09 ENCOUNTER — Telehealth: Payer: Self-pay | Admitting: Licensed Clinical Social Worker

## 2019-04-09 NOTE — Telephone Encounter (Signed)
Call placed to patient regarding [facility] appointment. Pt did not answer and there was no voicemail for LCSW to leave a message requesting a return call.

## 2019-04-12 DIAGNOSIS — K5100 Ulcerative (chronic) pancolitis without complications: Secondary | ICD-10-CM | POA: Diagnosis not present

## 2019-04-12 DIAGNOSIS — K852 Alcohol induced acute pancreatitis without necrosis or infection: Secondary | ICD-10-CM | POA: Diagnosis not present

## 2019-04-14 DIAGNOSIS — K5100 Ulcerative (chronic) pancolitis without complications: Secondary | ICD-10-CM | POA: Diagnosis not present

## 2019-04-17 ENCOUNTER — Ambulatory Visit (INDEPENDENT_AMBULATORY_CARE_PROVIDER_SITE_OTHER): Payer: BC Managed Care – PPO

## 2019-04-17 ENCOUNTER — Encounter: Payer: Self-pay | Admitting: Podiatry

## 2019-04-17 ENCOUNTER — Other Ambulatory Visit: Payer: Self-pay

## 2019-04-17 ENCOUNTER — Ambulatory Visit (INDEPENDENT_AMBULATORY_CARE_PROVIDER_SITE_OTHER): Payer: BC Managed Care – PPO | Admitting: Podiatry

## 2019-04-17 VITALS — BP 149/109 | HR 86 | Temp 97.5°F | Resp 16

## 2019-04-17 DIAGNOSIS — S8411XA Injury of peroneal nerve at lower leg level, right leg, initial encounter: Secondary | ICD-10-CM | POA: Diagnosis not present

## 2019-04-17 DIAGNOSIS — S99921A Unspecified injury of right foot, initial encounter: Secondary | ICD-10-CM

## 2019-04-17 DIAGNOSIS — M21371 Foot drop, right foot: Secondary | ICD-10-CM | POA: Diagnosis not present

## 2019-04-17 DIAGNOSIS — M779 Enthesopathy, unspecified: Secondary | ICD-10-CM

## 2019-04-17 NOTE — Progress Notes (Signed)
   HPI: 43 y.o. male presenting today as a new patient for evaluation of right lower extremity injury.  Patient states that approximately 3-4 weeks ago he was in his garage when he tripped over a toy and twisted his ankle.  Over the next few days he began to notice that he could not lift his foot up.  He says that the pain has improved over the last 3-4 weeks and today it is not so painful, rather he is more concerned about the loss of motion in his foot.  He presents for further treatment evaluation  Past Medical History:  Diagnosis Date  . Allergy   . Anxiety   . Colitis   . Ulcerative colitis [facility])      Physical Exam: General: The patient is alert and oriented x3 in no acute distress.  Dermatology: Skin is warm, dry and supple bilateral lower extremities. Negative for open lesions or macerations.  Vascular: Palpable pedal pulses bilaterally. No edema or erythema noted. Capillary refill within normal limits.  Neurological: Epicritic and protective threshold grossly intact bilaterally.  Paresthesia noted along the lateral aspect of the right leg  Musculoskeletal Exam: Range of motion within normal limits to all pedal and ankle joints bilateral. Muscle strength 0 /5 with dorsiflexion and eversion of the right foot consistent with foot drop/peroneal nerve damage  Radiographic Exam:  Normal osseous mineralization. Joint spaces preserved. No fracture/dislocation/boney destruction.    Assessment: 1.  Acute foot drop right lower extremity secondary to injury 2.  Peroneal nerve damage right leg   Plan of Care:  1. Patient evaluated. X-Rays reviewed.  2.  I explained to the patient that his symptoms are of the nerve etiology.  I explained that the peroneal nerve and its function. 3.  Today we can order nerve conduction study 4.  Also we are going to order an MRI right leg 5.  Immobilization cam boot provided.  Weightbearing as tolerated 6.  Consult neurology placed 7.  Return to clinic  as needed       Edrick Kins, DPM [facility]  Dr. Edrick Kins, DPM    [address], [place] [address]                Office [phone]  Fax [phone]

## 2019-04-19 ENCOUNTER — Telehealth: Payer: Self-pay | Admitting: Podiatry

## 2019-04-19 ENCOUNTER — Other Ambulatory Visit: Payer: Self-pay

## 2019-04-19 ENCOUNTER — Telehealth: Payer: Self-pay | Admitting: *Deleted

## 2019-04-19 ENCOUNTER — Other Ambulatory Visit: Payer: Self-pay | Admitting: Podiatry

## 2019-04-19 DIAGNOSIS — M21371 Foot drop, right foot: Secondary | ICD-10-CM

## 2019-04-19 DIAGNOSIS — S8411XA Injury of peroneal nerve at lower leg level, right leg, initial encounter: Secondary | ICD-10-CM

## 2019-04-19 DIAGNOSIS — S99921A Unspecified injury of right foot, initial encounter: Secondary | ICD-10-CM

## 2019-04-19 NOTE — Telephone Encounter (Signed)
Faxed orders and referral to [facility].

## 2019-04-19 NOTE — Telephone Encounter (Signed)
Jearld Fenton, [place] states Case:  [id number] for MRI right TibFib [phone] needs Physician Review. Faxed clinicals, order and demographics to [facility] Physician Review.

## 2019-04-19 NOTE — Telephone Encounter (Signed)
Faxed orders, referral, clinicals, and demographics to [facility]. Orders for MRI to Jearld Fenton, [place] for pre-cert and faxed to [place].

## 2019-04-19 NOTE — Telephone Encounter (Signed)
Pt was seen in office on Saturday and was referred to [facility] but they are not in network with his particular insurance. Pt would like to have the referral sent to [facility] at [facility] in [facility].

## 2019-04-19 NOTE — Telephone Encounter (Signed)
I informed pt the change to [facility] had been made.

## 2019-04-19 NOTE — Telephone Encounter (Signed)
-----   Message from Edrick Kins, DPM sent at 04/17/2019 12:36 PM EST ----- Regarding: Nerve conduction study, MRI, neurology consult Hi Camrynn Mcclintic,   This patient has acute foot drop due to injury.  I saw him this past Saturday, 04/17/2019.  We need to order a nerve conduction study and MRI w/out contrast right leg.   Also please consult neurology  Diagnosis: Acute foot drop secondary to injury.  Peroneal nerve damage right lower extremity  Thanks, Dr. Amalia Hailey

## 2019-04-19 NOTE — Addendum Note (Signed)
Addended by: Harriett Sine D on: 04/19/2019 03:04 PM   Modules accepted: Orders

## 2019-04-19 NOTE — Addendum Note (Signed)
Addended by: Harriett Sine D on: 04/19/2019 03:18 PM   Modules accepted: Orders

## 2019-04-20 ENCOUNTER — Telehealth: Payer: Self-pay | Admitting: Podiatry

## 2019-04-20 NOTE — Telephone Encounter (Signed)
I spoke with Manuela Schwartz - [facility] and informed I had faxed again to the [phone] fax for a referral and nerve testing for pt and had received a confirmation for today. Manuela Schwartz states she will let the referral group know to be on the lookout for the referral and orders.

## 2019-04-20 NOTE — Telephone Encounter (Signed)
Tesla from [facility]) called stating they had received the referral for the patient but do not have an appt for an EMG until after the first of January. Tesla would like to know if the doctor is wanting the patient to come in for a visit with them or just have the nerve conduction test done. She did mention that if the patient came in for an appt and the doctor viewed it as something urgent then the doctor can sometimes get patients in sooner for testing.  Please give her a call.

## 2019-04-20 NOTE — Telephone Encounter (Signed)
Pt called stating he reached out to [facility] to schedule his appt and they stated they had not received the referral from our office yet. Pt is calling to follow up.

## 2019-04-20 NOTE — Telephone Encounter (Signed)
I informed pt both the referral and nerve testing had been confirmed received by [facility], I would refax to [facility].

## 2019-04-21 NOTE — Telephone Encounter (Signed)
I spoke with [facility] - Tesla and informed Dr. Amalia Hailey wanted pt to be seen by a neurologist as well as tested. Tesla states the the referral coordinator is out of the office and she is taking care of that scheduling and will get pt in with neurologist and testing.

## 2019-04-22 ENCOUNTER — Encounter: Payer: BC Managed Care – PPO | Admitting: Neurology

## 2019-04-30 ENCOUNTER — Other Ambulatory Visit: Payer: Self-pay | Admitting: Family Medicine

## 2019-04-30 DIAGNOSIS — F419 Anxiety disorder, unspecified: Secondary | ICD-10-CM

## 2019-05-08 ENCOUNTER — Other Ambulatory Visit: Payer: BC Managed Care – PPO

## 2019-05-10 DIAGNOSIS — M21371 Foot drop, right foot: Secondary | ICD-10-CM | POA: Diagnosis not present

## 2019-05-10 DIAGNOSIS — R202 Paresthesia of skin: Secondary | ICD-10-CM | POA: Diagnosis not present

## 2019-05-10 DIAGNOSIS — R200 Anesthesia of skin: Secondary | ICD-10-CM | POA: Diagnosis not present

## 2019-05-17 ENCOUNTER — Encounter: Payer: Self-pay | Admitting: Family Medicine

## 2019-05-20 ENCOUNTER — Ambulatory Visit: Payer: BC Managed Care – PPO

## 2019-06-04 LAB — HM HEPATITIS C SCREENING LAB: HM Hepatitis Screen: NEGATIVE

## 2019-07-03 ENCOUNTER — Emergency Department (HOSPITAL_COMMUNITY)
Admission: EM | Admit: 2019-07-03 | Discharge: 2019-07-03 | Disposition: A | Discharge status: Home / Self Care | Payer: BC Managed Care – PPO | Attending: Emergency Medicine | Admitting: Emergency Medicine

## 2019-07-03 ENCOUNTER — Encounter (HOSPITAL_COMMUNITY): Payer: Self-pay | Admitting: Emergency Medicine

## 2019-07-03 ENCOUNTER — Emergency Department (HOSPITAL_COMMUNITY): Payer: BC Managed Care – PPO

## 2019-07-03 DIAGNOSIS — W010XXA Fall on same level from slipping, tripping and stumbling without subsequent striking against object, initial encounter: Secondary | ICD-10-CM | POA: Diagnosis not present

## 2019-07-03 DIAGNOSIS — W19XXXA Unspecified fall, initial encounter: Secondary | ICD-10-CM

## 2019-07-03 DIAGNOSIS — S6992XA Unspecified injury of left wrist, hand and finger(s), initial encounter: Secondary | ICD-10-CM | POA: Diagnosis not present

## 2019-07-03 DIAGNOSIS — S52532A Colles' fracture of left radius, initial encounter for closed fracture: Secondary | ICD-10-CM | POA: Diagnosis not present

## 2019-07-03 DIAGNOSIS — Y9301 Activity, walking, marching and hiking: Secondary | ICD-10-CM | POA: Diagnosis not present

## 2019-07-03 DIAGNOSIS — Z79899 Other long term (current) drug therapy: Secondary | ICD-10-CM | POA: Diagnosis not present

## 2019-07-03 DIAGNOSIS — Z03818 Encounter for observation for suspected exposure to other biological agents ruled out: Secondary | ICD-10-CM | POA: Diagnosis not present

## 2019-07-03 DIAGNOSIS — Y999 Unspecified external cause status: Secondary | ICD-10-CM | POA: Diagnosis not present

## 2019-07-03 DIAGNOSIS — Y92015 Private garage of single-family (private) house as the place of occurrence of the external cause: Secondary | ICD-10-CM | POA: Diagnosis not present

## 2019-07-03 DIAGNOSIS — S52572A Other intraarticular fracture of lower end of left radius, initial encounter for closed fracture: Secondary | ICD-10-CM | POA: Diagnosis not present

## 2019-07-03 MED ORDER — KETOROLAC TROMETHAMINE 60 MG/2ML IM SOLN
30.0000 mg | Freq: Once | INTRAMUSCULAR | Status: AC
  Administered 2019-07-03: 16:00:00 30 mg via INTRAMUSCULAR
  Filled 2019-07-03: qty 2

## 2019-07-03 MED ORDER — ACETAMINOPHEN 500 MG PO TABS
1000.0000 mg | ORAL_TABLET | Freq: Once | ORAL | Status: AC
  Administered 2019-07-03: 1000 mg via ORAL
  Filled 2019-07-03: qty 2

## 2019-07-03 MED ORDER — NAPROXEN 500 MG PO TBEC: 500.0000 mg | DELAYED_RELEASE_TABLET | Freq: Two times a day (BID) | ORAL | 0 refills | Status: DC

## 2019-07-03 NOTE — Progress Notes (Signed)
Orthopedic Tech Progress Note Patient Details:  Charles Hardy 20-Nov-1975 [id number]  Ortho Devices Type of Ortho Device: Volar splint Ortho Device/Splint Location: LUE Ortho Device/Splint Interventions: Ordered, Application   Post Interventions Patient Tolerated: Well Instructions Provided: Care of device, Adjustment of device   Janit Pagan 07/03/2019, 4:20 PM

## 2019-07-03 NOTE — ED Notes (Signed)
Patient verbalizes understanding of discharge instructions. Opportunity for questioning and answers were provided. Pt discharged from ED. 
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
## 2019-07-03 NOTE — ED Provider Notes (Signed)
[facility] EMERGENCY DEPARTMENT Provider Note   CSN: [id number] Arrival date & time: 07/03/19  1239     History Chief Complaint  Patient presents with  . Wrist Pain  . Fall    Charles Hardy is a 44 y.o. male with history of anxiety, ulcerative colitis, and EtOH abuse who has just come out of rehab and is on naltrexone presents to the emergency department status post mechanical fall which occurred shortly PTA with complaints of left wrist pain. Patient states he was walking backward and tripped over his child's bike in the garage and fell onto his left hand.  Denies head injury or loss of consciousness.  Reports isolated injury to the left wrist, having pain that is significant with associated swelling, worse with movement, no alleviating factors.  He denies numbness, tingling, or weakness.  Denies other areas of injury.  He is right-hand dominant.  He states that he was told that when taking naltrexone he would not be able to take certain pain medications.  HPI     Past Medical History:  Diagnosis Date  . Allergy   . Anxiety   . Colitis   . Ulcerative colitis [facility])     Patient Active Problem List   Diagnosis Date Noted  . Acute pancreatitis 04/03/2019  . Pneumobilia 04/03/2019  . Hyponatremia 04/03/2019  . Hypokalemia 04/03/2019  . Alcohol abuse 04/03/2019  . Atrial fibrillation with RVR ([place]) 04/03/2019  . Iron deficiency anemia   . Moderate protein-calorie malnutrition ([place])   . Alcohol withdrawal ([place]) 02/10/2019  . Ulcerative colitis ([place]) 02/10/2019  . Normochromic normocytic anemia 02/10/2019    History reviewed. No pertinent surgical history.     Family History  Problem Relation Age of Onset  . Diabetes Mother   . Hypertension Mother   . Diabetes Maternal Grandfather   . Hyperlipidemia Maternal Grandfather   . Hypertension Maternal Grandfather     Social History   Tobacco Use  . Smoking status: Never Smoker  . Smokeless  tobacco: Never Used  Substance Use Topics  . Alcohol use: Yes    Comment: Vodka everyday.  . Drug use: Never    Home Medications Prior to Admission medications   Medication Sig Start Date End Date Taking? Authorizing Provider  acetaminophen (TYLENOL) 325 MG tablet Take 2 tablets (650 mg total) by mouth every 6 (six) hours as needed for mild pain (or Fever >/= 101). 04/07/19   Shahmehdi, Valeria [place], MD  Adalimumab (HUMIRA) 40 MG/0.4ML PSKT Inject 40 mg into the skin once a week.     [provider]  cholecalciferol (VITAMIN D3) 25 MCG (1000 UT) tablet Take 1,000 Units by mouth daily.    [provider]  docusate sodium (COLACE) 100 MG capsule Take 100 mg by mouth daily as needed for mild constipation.    [provider]  folic acid (FOLVITE) 1 MG tablet Take 1 tablet (1 mg total) by mouth daily. 04/08/19   Charlott Rakes, MD  Multiple Vitamin (MULTIVITAMIN) capsule Take 1 capsule by mouth daily.    [provider]  thiamine 100 MG tablet Take 1 tablet (100 mg total) by mouth daily. 04/08/19   Charlott Rakes, MD  venlafaxine XR (EFFEXOR-XR) 37.5 MG 24 hr capsule TAKE 1 CAPSULE BY MOUTH DAILY WITH BREAKFAST. 05/03/19   Charlott Rakes, MD    Allergies    Bee venom  Review of Systems   Review of Systems  Constitutional: Negative for chills and fever.  Respiratory: Negative for shortness of breath.   Cardiovascular: Negative for chest pain.  Gastrointestinal: Negative for abdominal pain.  Musculoskeletal: Positive for arthralgias and joint swelling. Negative for back pain and neck pain.  Skin: Negative for wound.  Neurological: Negative for weakness, numbness and headaches.    Physical Exam Updated Vital Signs BP 123/82 (BP Location: Right Arm)   Pulse 60   Temp 98.3 F (36.8 C) (Oral)   Resp 18   Ht 5' 11"  (1.803 m)   Wt 90.7 kg   SpO2 100%   BMI 27.89 kg/m   Physical Exam Vitals and nursing note reviewed.  Constitutional:      General: He  is not in acute distress.    Appearance: Normal appearance. He is well-developed. He is not ill-appearing or toxic-appearing.  HENT:     Head: Normocephalic and atraumatic.     Comments: No raccoon eyes or battle sign. Eyes:     General:        Right eye: No discharge.        Left eye: No discharge.     Conjunctiva/sclera: Conjunctivae normal.  Neck:     Comments: No midline tenderness.   Cardiovascular:     Rate and Rhythm: Normal rate and regular rhythm.     Pulses:          Radial pulses are 2+ on the right side and 2+ on the left side.  Pulmonary:     Effort: Pulmonary effort is normal. No respiratory distress.     Breath sounds: Normal breath sounds. No wheezing, rhonchi or rales.  Abdominal:     General: There is no distension.     Palpations: Abdomen is soft.     Tenderness: There is no abdominal tenderness.  Musculoskeletal:     Cervical back: Normal range of motion and neck supple.     Comments: Upper extremities: Patient has mild swelling and bruising with a degree of deformity to the left wrist dorsally.  No open wounds.  Patient has intact active range of motion throughout the bilateral upper extremities with the exception of mild limitation in left wrist flexion/extension, able to move some in each direction.  Patient is tender palpation over the dorsal aspect of the left wrist more to the radial side extending to the distal two thirds of the left forearm.  Otherwise upper extremities are nontender.  No anatomical snuffbox tenderness Back: No midline tenderness Lower extremities: No focal bony tenderness.  Skin:    General: Skin is warm and dry.     Capillary Refill: Capillary refill takes less than 2 seconds.     Findings: No rash.  Neurological:     Mental Status: He is alert.     Comments: Alert. Clear speech. Sensation grossly intact to bilateral upper extremities. 5/5 symmetric grip strength. Ambulatory.  Able to perform okay sign, thumbs up, and cross second/third  digits bilaterally.  Psychiatric:        Mood and Affect: Mood normal.        Behavior: Behavior normal.     ED Results / Procedures / Treatments   Labs (all labs ordered are listed, but only abnormal results are displayed) Labs Reviewed - No data to display  EKG None  Radiology DG Forearm Left  Result Date: 07/03/2019 CLINICAL DATA:  44 year old male status post fall onto outstretched hand with associated deformity. EXAM: LEFT FOREARM - 2 VIEW COMPARISON:  Concurrently obtained radiographs of the left wrist FINDINGS: Acute comminuted, posteriorly  displaced, impacted distal radius fracture. The fracture line extends to the articular surface. The more proximal radius is intact. No accompanying ulnar fracture. IMPRESSION: Acute comminuted and impacted Colles fracture of the distal radius. The distal fracture fragments are tilted dorsally. Electronically Signed   By: Jacqulynn Cadet M.D.   On: 07/03/2019 14:42   DG Wrist Complete Left  Result Date: 07/03/2019 CLINICAL DATA:  Fall, pain EXAM: LEFT WRIST - COMPLETE 3+ VIEW COMPARISON:  None. FINDINGS: Impacted and angulated, although extra-articular fractures of the distal left radial metadiaphysis. The distal ulna is intact. The carpus is normally aligned. Soft tissues are unremarkable. IMPRESSION: Impacted and angulated extra-articular fractures of the distal left radial metadiaphysis. Electronically Signed   By: Eddie Candle M.D.   On: 07/03/2019 13:39    Procedures Procedures (including critical care time)  Medications Ordered in ED Medications  acetaminophen (TYLENOL) tablet 1,000 mg (has no administration in time range)  ketorolac (TORADOL) injection 30 mg (has no administration in time range)    ED Course  I have reviewed the triage vital signs and the nursing notes.  Pertinent labs & imaging results that were available during my care of the patient were reviewed by me and considered in my medical decision making (see chart  for details).    MDM Rules/Calculators/A&P                      Patient presents to the emergency department status post mechanical fall with left wrist injury.  X-ray reveals impacted and angulated extra-articular fractures of the distal left radial metadiaphysis.  I have personally reviewed and interpreted images and in agreement with radiologist interpretation.  No open wounds to indicate open fracture.  Neurovascularly intact distally. 15:17: CONSULT: Discussed case with hand surgeon Dr. Lenon Curt, would not reduce in the ED, recommends volar splint and have patient call his office Monday morning, will likely operate next week.  Pancreatitis discussed.  Given patient is on naltrexone will attempt pain control with NSAIDs and Tylenol.  Patient does have a history of ulcerative colitis therefore would avoid extended course of NSAIDs. I discussed results, treatment plan, need for follow-up, and return precautions with the patient. Provided opportunity for questions, patient confirmed understanding and is in agreement with plan.   Findings and plan of care discussed with supervising physician Dr. Tamera Punt who is in agreement.    Final Clinical Impression(s) / ED Diagnoses Final diagnoses:  Closed Colles' fracture of left radius, initial encounter  Fall, initial encounter    Rx / DC Orders ED Discharge Orders         Ordered    naproxen (EC-NAPROXEN) 500 MG EC tablet  2 times daily with meals     07/03/19 1538           Anae Hams, [place], PA-C 07/03/19 1540    Malvin Johns, MD 07/03/19 1728

## 2019-07-03 NOTE — Discharge Instructions (Addendum)
Please read and follow all provided instructions.  You have been seen today after an injury to your left wrist Your xray shows a fracture of your radius.  We have placed you in a splint- please keep this on, clean & dry and intact until you have followed up with hand surgeon Dr. Lenon Curt  Please call Dr. Brennan Bailey office first thing Monday morning to schedule an appointment for early next week.  Home care instructions: -- *PRICE in the first 24-48 hours after injury: Protect with splint Rest Ice- Do not apply ice pack directly to your splint place towel or similar between your splint and ice/ice pack. Apply ice for 20 min, then remove for 40 min while awake Compression- splint Elevate affected extremity above the level of your heart when not walking around for the first 24-48 hours   Medications:  Please take Tylenol per over-the-counter dosing to help with discomfort. We have prescribed you naproxen. - Naproxen is a nonsteroidal anti-inflammatory medication that will help with pain and swelling. Be sure to take this medication as prescribed with food, 1 pill every 12 hours,  It should be taken with food, as it can cause stomach upset, and more seriously, stomach bleeding. Do not take other nonsteroidal anti-inflammatory medications with this such as Advil, Motrin, Aleve, Mobic, Goodie Powder, or Motrin.    We have prescribed you new medication(s) today. Discuss the medications prescribed today with your pharmacist as they can have adverse effects and interactions with your other medicines including over the counter and prescribed medications. Seek medical evaluation if you start to experience new or abnormal symptoms after taking one of these medicines, seek care immediately if you start to experience difficulty breathing, feeling of your throat closing, facial swelling, or rash as these could be indications of a more serious allergic reaction  We have prescribed you new medication(s) today. Discuss  the medications prescribed today with your pharmacist as they can have adverse effects and interactions with your other medicines including over the counter and prescribed medications. Seek medical evaluation if you start to experience new or abnormal symptoms after taking one of these medicines, seek care immediately if you start to experience difficulty breathing, feeling of your throat closing, facial swelling, or rash as these could be indications of a more serious allergic reaction   Follow-up instructions: Please follow-up with Dr. Brennan Bailey office  Return instructions:  Please return if your digits or extremity are numb or tingling, appear gray or blue, or you have severe pain (also elevate the extremity and loosen splint or wrap if you were given one) Please return if you have redness or fevers.  Please return to the Emergency Department if you experience worsening symptoms.  Please return if you have any other emergent concerns. Additional Information:  Your vital signs today were: BP 123/82 (BP Location: Right Arm)   Pulse 60   Temp 98.3 F (36.8 C) (Oral)   Resp 18   Ht 5' 11"  (1.803 m)   Wt 90.7 kg   SpO2 100%   BMI 27.89 kg/m  If your blood pressure (BP) was elevated above 135/85 this visit, please have this repeated by your doctor within one month. ---------------    Results for orders placed or performed during the hospital encounter of 04/03/19  Blood culture (routine x 2)   Specimen: BLOOD  Result Value Ref Range   Specimen Description BLOOD LEFT ANTECUBITAL    Special Requests      BOTTLES DRAWN AEROBIC AND ANAEROBIC  Blood Culture adequate volume   Culture      NO GROWTH 5 DAYS Performed at [facility] [address], [place], [place] [address]    Report Status 04/08/2019 FINAL   Blood culture (routine x 2)   Specimen: BLOOD RIGHT HAND  Result Value Ref Range   Specimen Description BLOOD RIGHT HAND    Special Requests      BOTTLES DRAWN AEROBIC AND  ANAEROBIC Blood Culture adequate volume   Culture      NO GROWTH 5 DAYS Performed at [facility], [place] [address] [place], [place] [address]    Report Status 04/08/2019 FINAL   SARS CORONAVIRUS 2 (TAT 6-24 HRS) Nasopharyngeal Nasopharyngeal Swab   Specimen: Nasopharyngeal Swab  Result Value Ref Range   SARS Coronavirus 2 NEGATIVE NEGATIVE  CBC with Differential  Result Value Ref Range   WBC 11.0 (H) 4.0 - 10.5 K/uL   RBC 4.30 4.22 - 5.81 MIL/uL   Hemoglobin 13.4 13.0 - 17.0 g/dL   HCT 38.9 (L) 39.0 - 52.0 %   MCV 90.5 80.0 - 100.0 fL   MCH 31.2 26.0 - 34.0 pg   MCHC 34.4 30.0 - 36.0 g/dL   RDW 15.7 (H) 11.5 - 15.5 %   Platelets 159 150 - 400 K/uL   nRBC 0.0 0.0 - 0.2 %   Neutrophils Relative % 85 %   Neutro Abs 9.4 (H) 1.7 - 7.7 K/uL   Lymphocytes Relative 5 %   Lymphs Abs 0.5 (L) 0.7 - 4.0 K/uL   Monocytes Relative 9 %   Monocytes Absolute 1.0 0.1 - 1.0 K/uL   Eosinophils Relative 0 %   Eosinophils Absolute 0.0 0.0 - 0.5 K/uL   Basophils Relative 0 %   Basophils Absolute 0.0 0.0 - 0.1 K/uL   Immature Granulocytes 1 %   Abs Immature Granulocytes 0.05 0.00 - 0.07 K/uL  Comprehensive metabolic panel  Result Value Ref Range   Sodium 126 (L) 135 - 145 mmol/L   Potassium 2.5 (LL) 3.5 - 5.1 mmol/L   Chloride 82 (L) 98 - 111 mmol/L   CO2 19 (L) 22 - 32 mmol/L   Glucose, Bld 106 (H) 70 - 99 mg/dL   BUN <5 (L) 6 - 20 mg/dL   Creatinine, Ser 0.62 0.61 - 1.24 mg/dL   Calcium 8.4 (L) 8.9 - 10.3 mg/dL   Total Protein 6.7 6.5 - 8.1 g/dL   Albumin 2.5 (L) 3.5 - 5.0 g/dL   AST 114 (H) 15 - 41 U/L   ALT 101 (H) 0 - 44 U/L   Alkaline Phosphatase 108 38 - 126 U/L   Total Bilirubin 2.1 (H) 0.3 - 1.2 mg/dL   GFR calc non Af Amer >60 >60 mL/min   GFR calc Af Amer >60 >60 mL/min   Anion gap 25 (H) 5 - 15  Lipase, blood  Result Value Ref Range   Lipase 38 11 - 51 U/L  Urinalysis, Routine w reflex microscopic  Result Value Ref Range   Color, Urine AMBER (A) YELLOW    APPearance CLEAR CLEAR   Specific Gravity, Urine 1.011 1.005 - 1.030   pH 6.0 5.0 - 8.0   Glucose, UA NEGATIVE NEGATIVE mg/dL   Hgb urine dipstick NEGATIVE NEGATIVE   Bilirubin Urine NEGATIVE NEGATIVE   Ketones, ur 80 (A) NEGATIVE mg/dL   Protein, ur 30 (A) NEGATIVE mg/dL   Nitrite NEGATIVE NEGATIVE   Leukocytes,Ua NEGATIVE NEGATIVE   RBC / HPF 0-5  0 - 5 RBC/hpf   WBC, UA 0-5 0 - 5 WBC/hpf   Bacteria, UA RARE (A) NONE SEEN   Mucus PRESENT   Lactic acid, plasma  Result Value Ref Range   Lactic Acid, Venous 3.2 (HH) 0.5 - 1.9 mmol/L  Lactic acid, plasma  Result Value Ref Range   Lactic Acid, Venous 1.6 0.5 - 1.9 mmol/L  C-reactive protein  Result Value Ref Range   CRP 25.7 (H) <1.0 mg/dL  Sedimentation rate  Result Value Ref Range   Sed Rate 25 (H) 0 - 16 mm/hr  Basic metabolic panel  Result Value Ref Range   Sodium 130 (L) 135 - 145 mmol/L   Potassium 2.9 (L) 3.5 - 5.1 mmol/L   Chloride 89 (L) 98 - 111 mmol/L   CO2 22 22 - 32 mmol/L   Glucose, Bld 107 (H) 70 - 99 mg/dL   BUN <5 (L) 6 - 20 mg/dL   Creatinine, Ser 0.82 0.61 - 1.24 mg/dL   Calcium 7.4 (L) 8.9 - 10.3 mg/dL   GFR calc non Af Amer >60 >60 mL/min   GFR calc Af Amer >60 >60 mL/min   Anion gap 19 (H) 5 - 15  Basic metabolic panel  Result Value Ref Range   Sodium 130 (L) 135 - 145 mmol/L   Potassium 3.3 (L) 3.5 - 5.1 mmol/L   Chloride 94 (L) 98 - 111 mmol/L   CO2 23 22 - 32 mmol/L   Glucose, Bld 120 (H) 70 - 99 mg/dL   BUN <5 (L) 6 - 20 mg/dL   Creatinine, Ser 0.66 0.61 - 1.24 mg/dL   Calcium 7.4 (L) 8.9 - 10.3 mg/dL   GFR calc non Af Amer >60 >60 mL/min   GFR calc Af Amer >60 >60 mL/min   Anion gap 13 5 - 15  Basic metabolic panel  Result Value Ref Range   Sodium 128 (L) 135 - 145 mmol/L   Potassium 3.3 (L) 3.5 - 5.1 mmol/L   Chloride 93 (L) 98 - 111 mmol/L   CO2 24 22 - 32 mmol/L   Glucose, Bld 135 (H) 70 - 99 mg/dL   BUN <5 (L) 6 - 20 mg/dL   Creatinine, Ser 0.57 (L) 0.61 - 1.24 mg/dL   Calcium  7.7 (L) 8.9 - 10.3 mg/dL   GFR calc non Af Amer >60 >60 mL/min   GFR calc Af Amer >60 >60 mL/min   Anion gap 11 5 - 15  Hepatic function panel  Result Value Ref Range   Total Protein 5.8 (L) 6.5 - 8.1 g/dL   Albumin 2.2 (L) 3.5 - 5.0 g/dL   AST 92 (H) 15 - 41 U/L   ALT 87 (H) 0 - 44 U/L   Alkaline Phosphatase 85 38 - 126 U/L   Total Bilirubin 1.9 (H) 0.3 - 1.2 mg/dL   Bilirubin, Direct 0.7 (H) 0.0 - 0.2 mg/dL   Indirect Bilirubin 1.2 (H) 0.3 - 0.9 mg/dL  Magnesium  Result Value Ref Range   Magnesium 1.9 1.7 - 2.4 mg/dL  Phosphorus  Result Value Ref Range   Phosphorus 2.3 (L) 2.5 - 4.6 mg/dL  TSH  Result Value Ref Range   TSH 0.736 0.350 - 4.500 uIU/mL  Glucose, capillary  Result Value Ref Range   Glucose-Capillary 135 (H) 70 - 99 mg/dL   Comment 1 Notify RN   CBC  Result Value Ref Range   WBC 6.8 4.0 - 10.5 K/uL   RBC 3.85 (L)  4.22 - 5.81 MIL/uL   Hemoglobin 12.2 (L) 13.0 - 17.0 g/dL   HCT 35.8 (L) 39.0 - 52.0 %   MCV 93.0 80.0 - 100.0 fL   MCH 31.7 26.0 - 34.0 pg   MCHC 34.1 30.0 - 36.0 g/dL   RDW 16.3 (H) 11.5 - 15.5 %   Platelets 218 150 - 400 K/uL   nRBC 0.0 0.0 - 0.2 %  Comprehensive metabolic panel  Result Value Ref Range   Sodium 132 (L) 135 - 145 mmol/L   Potassium 3.3 (L) 3.5 - 5.1 mmol/L   Chloride 95 (L) 98 - 111 mmol/L   CO2 25 22 - 32 mmol/L   Glucose, Bld 117 (H) 70 - 99 mg/dL   BUN <5 (L) 6 - 20 mg/dL   Creatinine, Ser 0.69 0.61 - 1.24 mg/dL   Calcium 7.9 (L) 8.9 - 10.3 mg/dL   Total Protein 6.0 (L) 6.5 - 8.1 g/dL   Albumin 2.1 (L) 3.5 - 5.0 g/dL   AST 65 (H) 15 - 41 U/L   ALT 74 (H) 0 - 44 U/L   Alkaline Phosphatase 85 38 - 126 U/L   Total Bilirubin 1.2 0.3 - 1.2 mg/dL   GFR calc non Af Amer >60 >60 mL/min   GFR calc Af Amer >60 >60 mL/min   Anion gap 12 5 - 15  Glucose, capillary  Result Value Ref Range   Glucose-Capillary 122 (H) 70 - 99 mg/dL  Lipase, blood  Result Value Ref Range   Lipase 19 11 - 51 U/L  CBC  Result Value Ref Range     WBC 6.2 4.0 - 10.5 K/uL   RBC 3.34 (L) 4.22 - 5.81 MIL/uL   Hemoglobin 10.7 (L) 13.0 - 17.0 g/dL   HCT 31.6 (L) 39.0 - 52.0 %   MCV 94.6 80.0 - 100.0 fL   MCH 32.0 26.0 - 34.0 pg   MCHC 33.9 30.0 - 36.0 g/dL   RDW 16.0 (H) 11.5 - 15.5 %   Platelets 261 150 - 400 K/uL   nRBC 0.0 0.0 - 0.2 %  Comprehensive metabolic panel  Result Value Ref Range   Sodium 132 (L) 135 - 145 mmol/L   Potassium 3.2 (L) 3.5 - 5.1 mmol/L   Chloride 96 (L) 98 - 111 mmol/L   CO2 25 22 - 32 mmol/L   Glucose, Bld 139 (H) 70 - 99 mg/dL   BUN <5 (L) 6 - 20 mg/dL   Creatinine, Ser 0.67 0.61 - 1.24 mg/dL   Calcium 7.8 (L) 8.9 - 10.3 mg/dL   Total Protein 5.4 (L) 6.5 - 8.1 g/dL   Albumin 1.8 (L) 3.5 - 5.0 g/dL   AST 37 15 - 41 U/L   ALT 49 (H) 0 - 44 U/L   Alkaline Phosphatase 77 38 - 126 U/L   Total Bilirubin 1.0 0.3 - 1.2 mg/dL   GFR calc non Af Amer >60 >60 mL/min   GFR calc Af Amer >60 >60 mL/min   Anion gap 11 5 - 15  Glucose, capillary  Result Value Ref Range   Glucose-Capillary 114 (H) 70 - 99 mg/dL  CBC  Result Value Ref Range   WBC 6.8 4.0 - 10.5 K/uL   RBC 3.36 (L) 4.22 - 5.81 MIL/uL   Hemoglobin 10.5 (L) 13.0 - 17.0 g/dL   HCT 31.9 (L) 39.0 - 52.0 %   MCV 94.9 80.0 - 100.0 fL   MCH 31.3 26.0 - 34.0 pg   MCHC  32.9 30.0 - 36.0 g/dL   RDW 15.7 (H) 11.5 - 15.5 %   Platelets 327 150 - 400 K/uL   nRBC 0.0 0.0 - 0.2 %  Comprehensive metabolic panel  Result Value Ref Range   Sodium 131 (L) 135 - 145 mmol/L   Potassium 3.9 3.5 - 5.1 mmol/L   Chloride 98 98 - 111 mmol/L   CO2 25 22 - 32 mmol/L   Glucose, Bld 148 (H) 70 - 99 mg/dL   BUN <5 (L) 6 - 20 mg/dL   Creatinine, Ser 0.66 0.61 - 1.24 mg/dL   Calcium 7.9 (L) 8.9 - 10.3 mg/dL   Total Protein 5.4 (L) 6.5 - 8.1 g/dL   Albumin 1.8 (L) 3.5 - 5.0 g/dL   AST 26 15 - 41 U/L   ALT 36 0 - 44 U/L   Alkaline Phosphatase 70 38 - 126 U/L   Total Bilirubin 0.8 0.3 - 1.2 mg/dL   GFR calc non Af Amer >60 >60 mL/min   GFR calc Af Amer >60 >60  mL/min   Anion gap 8 5 - 15  Glucose, capillary  Result Value Ref Range   Glucose-Capillary 124 (H) 70 - 99 mg/dL   DG Forearm Left  Result Date: 07/03/2019 CLINICAL DATA:  44 year old male status post fall onto outstretched hand with associated deformity. EXAM: LEFT FOREARM - 2 VIEW COMPARISON:  Concurrently obtained radiographs of the left wrist FINDINGS: Acute comminuted, posteriorly displaced, impacted distal radius fracture. The fracture line extends to the articular surface. The more proximal radius is intact. No accompanying ulnar fracture. IMPRESSION: Acute comminuted and impacted Colles fracture of the distal radius. The distal fracture fragments are tilted dorsally. Electronically Signed   By: Jacqulynn Cadet M.D.   On: 07/03/2019 14:42   DG Wrist Complete Left  Result Date: 07/03/2019 CLINICAL DATA:  Fall, pain EXAM: LEFT WRIST - COMPLETE 3+ VIEW COMPARISON:  None. FINDINGS: Impacted and angulated, although extra-articular fractures of the distal left radial metadiaphysis. The distal ulna is intact. The carpus is normally aligned. Soft tissues are unremarkable. IMPRESSION: Impacted and angulated extra-articular fractures of the distal left radial metadiaphysis. Electronically Signed   By: Eddie Candle M.D.   On: 07/03/2019 13:39

## 2019-07-03 NOTE — ED Notes (Signed)
Patient transported to X-ray 

## 2019-07-05 DIAGNOSIS — S52532A Colles' fracture of left radius, initial encounter for closed fracture: Secondary | ICD-10-CM | POA: Diagnosis not present

## 2019-07-06 DIAGNOSIS — S52552A Other extraarticular fracture of lower end of left radius, initial encounter for closed fracture: Secondary | ICD-10-CM | POA: Diagnosis not present

## 2019-07-06 DIAGNOSIS — X58XXXA Exposure to other specified factors, initial encounter: Secondary | ICD-10-CM | POA: Diagnosis not present

## 2019-07-06 DIAGNOSIS — M25532 Pain in left wrist: Secondary | ICD-10-CM | POA: Diagnosis not present

## 2019-07-06 DIAGNOSIS — Y939 Activity, unspecified: Secondary | ICD-10-CM | POA: Diagnosis not present

## 2019-07-06 DIAGNOSIS — I10 Essential (primary) hypertension: Secondary | ICD-10-CM | POA: Diagnosis not present

## 2019-07-06 DIAGNOSIS — S52532A Colles' fracture of left radius, initial encounter for closed fracture: Secondary | ICD-10-CM | POA: Diagnosis not present

## 2019-07-06 DIAGNOSIS — Y929 Unspecified place or not applicable: Secondary | ICD-10-CM | POA: Diagnosis not present

## 2019-07-06 DIAGNOSIS — Y999 Unspecified external cause status: Secondary | ICD-10-CM | POA: Diagnosis not present

## 2019-07-12 ENCOUNTER — Encounter: Payer: Self-pay | Admitting: Family Medicine

## 2019-07-12 ENCOUNTER — Telehealth: Payer: Self-pay | Admitting: General Practice

## 2019-07-12 NOTE — Telephone Encounter (Signed)
Patient called requesting to speak with the nurse regarding his medications. Patient states that he was prescribed medication at [facility] and the facility faxed over a release of information regarding his medication. Please f/u with pt.

## 2019-07-12 NOTE — Telephone Encounter (Signed)
Patient sent in a MyChart message regarding this on 07/07/2019. Left patient know that we have not received anything regarding his stay at rehab or medications. Sent patient to front desk staff to schedule a follow up appointment.

## 2019-07-13 NOTE — Telephone Encounter (Signed)
Spoke with patient, who states that he has an appointment with Ortho on 07/19/19 to follow up on his broken arm. Needs to reschedule appointment to 07/21/2019.  Patient also verified the number that the records were sent to. Was not the correct number for our office. Gave him [phone] as well as the back fax line.

## 2019-07-16 ENCOUNTER — Encounter: Payer: Self-pay | Admitting: Family Medicine

## 2019-07-16 ENCOUNTER — Other Ambulatory Visit: Payer: Self-pay | Admitting: Internal Medicine

## 2019-07-16 MED ORDER — OXYCODONE HCL 5 MG PO CAPS: 5.0000 mg | ORAL_CAPSULE | ORAL | 0 refills | Status: DC | PRN

## 2019-07-19 ENCOUNTER — Ambulatory Visit: Payer: BC Managed Care – PPO

## 2019-07-19 DIAGNOSIS — S52532D Colles' fracture of left radius, subsequent encounter for closed fracture with routine healing: Secondary | ICD-10-CM | POA: Diagnosis not present

## 2019-07-21 ENCOUNTER — Ambulatory Visit (INDEPENDENT_AMBULATORY_CARE_PROVIDER_SITE_OTHER): Payer: BC Managed Care – PPO | Admitting: Nurse Practitioner

## 2019-07-21 DIAGNOSIS — F1010 Alcohol abuse, uncomplicated: Secondary | ICD-10-CM

## 2019-07-21 DIAGNOSIS — F329 Major depressive disorder, single episode, unspecified: Secondary | ICD-10-CM

## 2019-07-21 DIAGNOSIS — K5180 Other ulcerative colitis without complications: Secondary | ICD-10-CM | POA: Diagnosis not present

## 2019-07-21 DIAGNOSIS — I10 Essential (primary) hypertension: Secondary | ICD-10-CM | POA: Diagnosis not present

## 2019-07-21 DIAGNOSIS — F32A Depression, unspecified: Secondary | ICD-10-CM

## 2019-07-21 DIAGNOSIS — F419 Anxiety disorder, unspecified: Secondary | ICD-10-CM

## 2019-07-21 MED ORDER — PROPRANOLOL HCL 10 MG PO TABS: 10.0000 mg | ORAL_TABLET | Freq: Two times a day (BID) | ORAL | 0 refills | Status: DC

## 2019-07-21 MED ORDER — PROPRANOLOL HCL 10 MG PO TABS: 10.0000 mg | ORAL_TABLET | Freq: Three times a day (TID) | ORAL | 0 refills | Status: DC

## 2019-07-21 MED ORDER — TRAZODONE HCL 50 MG PO TABS: 50.0000 mg | ORAL_TABLET | Freq: Every evening | ORAL | 0 refills | Status: DC | PRN

## 2019-07-21 MED ORDER — VENLAFAXINE HCL ER 37.5 MG PO CP24: ORAL_CAPSULE | ORAL | 0 refills | Status: DC

## 2019-07-21 MED ORDER — NALTREXONE HCL 50 MG PO TABS: 50.0000 mg | ORAL_TABLET | Freq: Every day | ORAL | 2 refills | Status: AC

## 2019-07-21 MED ORDER — OMEPRAZOLE 40 MG PO CPDR: 40.0000 mg | DELAYED_RELEASE_CAPSULE | Freq: Every day | ORAL | 0 refills | Status: DC

## 2019-07-21 NOTE — Progress Notes (Signed)
Virtual Visit via Telephone Note Due to national recommendations of social distancing due to [place] 19, telehealth visit is felt to be most appropriate for this patient at this time.  I discussed the limitations, risks, security and privacy concerns of performing an evaluation and management service by telephone and the availability of in person appointments. I also discussed with the patient that there may be a patient responsible charge related to this service. The patient expressed understanding and agreed to proceed.    I connected with Charles Hardy on 07/21/19  at   2:30 PM EDT  EDT by telephone and verified that I am speaking with the correct person using two identifiers.   Consent I discussed the limitations, risks, security and privacy concerns of performing an evaluation and management service by telephone and the availability of in person appointments. I also discussed with the patient that there may be a patient responsible charge related to this service. The patient expressed understanding and agreed to proceed.   Location of Patient: Private Residence   Location of Provider: [facility] and [employer] Office    Persons participating in Telemedicine visit: Charles Rankins FNP-BC [place]    History of Present Illness: Telemedicine visit for: Medication Refills   ETOH ABUSE He was recently treated for 28 days at a residential treatment center for ETOH abuse ([facility]). He is currently attending [place] meetings. Taking naltrexone 50 mg daily (I have instructed him I will fill until his f/u with Dr. Juleen China and then she will decide further treatment), Trazodone 50 mg at bedtime, effexor 37.5 mg daily.      Essential Hypertension Taking propranolol 10 mg TID for HTN and anxiety. Requesting to decrease to twice a day instead of TID as it is hard for him to take the medications 3 times per day with his schedule. Instructed to monitor home blood  pressure readings and resume to TID if BP readings consistently >140/90. Current blood pressure readings are controlled.  Does not endorse chest pain, shortness of breath, palpitations, lightheadedness, dizziness, headaches or BLE edema.  BP Readings from Last 3 Encounters:  07/03/19 116/77  04/17/19 (!) 149/109  04/07/19 (!) 137/106    Past Medical History:  Diagnosis Date  . Allergy   . Anxiety   . Colitis   . Ulcerative colitis ([place])     History reviewed. No pertinent surgical history.  Family History  Problem Relation Age of Onset  . Diabetes Mother   . Hypertension Mother   . Diabetes Maternal Grandfather   . Hyperlipidemia Maternal Grandfather   . Hypertension Maternal Grandfather     Social History   Socioeconomic History  . Marital status: Married    Spouse name: Not on file  . Number of children: Not on file  . Years of education: Not on file  . Highest education level: Not on file  Occupational History  . Not on file  Tobacco Use  . Smoking status: Never Smoker  . Smokeless tobacco: Never Used  Substance and Sexual Activity  . Alcohol use: Yes    Comment: Vodka everyday.  . Drug use: Never  . Sexual activity: Yes  Other Topics Concern  . Not on file  Social History Narrative  . Not on file   Social Determinants of Health   Financial Resource Strain:   . Difficulty of Paying Living Expenses:   Food Insecurity:   . Worried About Charity fundraiser in the Last Year:   .  Ran Out of Food in the Last Year:   Transportation Needs:   . Film/video editor (Medical):   Marland Kitchen Lack of Transportation (Non-Medical):   Physical Activity:   . Days of Exercise per Week:   . Minutes of Exercise per Session:   Stress:   . Feeling of Stress :   Social Connections:   . Frequency of Communication with Friends and Family:   . Frequency of Social Gatherings with Friends and Family:   . Attends Religious Services:   . Active Member of Clubs or Organizations:   .  Attends Archivist Meetings:   Marland Kitchen Marital Status:      Observations/Objective: Awake, alert and oriented x 3   Review of Systems  Constitutional: Negative for fever, malaise/fatigue and weight loss.  HENT: Negative.  Negative for nosebleeds.   Eyes: Negative.  Negative for blurred vision, double vision and photophobia.  Respiratory: Negative.  Negative for cough and shortness of breath.   Cardiovascular: Negative.  Negative for chest pain, palpitations and leg swelling.  Gastrointestinal: Negative.  Negative for heartburn, nausea and vomiting.  Musculoskeletal: Negative.  Negative for myalgias.  Neurological: Negative.  Negative for dizziness, focal weakness, seizures and headaches.  Psychiatric/Behavioral: Positive for depression and substance abuse. Negative for suicidal ideas. The patient has insomnia.     Assessment and Plan: [name] was seen today for medication refill.  Diagnoses and all orders for this visit:  ETOH abuse -     naltrexone (DEPADE) 50 MG tablet; Take 1 tablet (50 mg total) by mouth at bedtime.  Anxiety -     venlafaxine XR (EFFEXOR-XR) 37.5 MG 24 hr capsule; TAKE 1 CAPSULE BY MOUTH DAILY WITH BREAKFAST.  Other ulcerative colitis without complication (HCC) -     omeprazole (PRILOSEC) 40 MG capsule; Take 1 capsule (40 mg total) by mouth daily.  Essential hypertension -     propranolol (INDERAL) 10 MG tablet; Take 1 tablet (10 mg total) by mouth 2 times daily. Continue all antihypertensives as prescribed.  Remember to bring in your blood pressure log with you for your follow up appointment.  DASH/Mediterranean Diets are healthier choices for HTN.    Anxiety and depression -     traZODone (DESYREL) 50 MG tablet; Take 1 tablet (50 mg total) by mouth at bedtime as needed.     Follow Up Instructions Return in about 3 months (around 10/21/2019) for HTN.     I discussed the assessment and treatment plan with the patient. The patient was provided an  opportunity to ask questions and all were answered. The patient agreed with the plan and demonstrated an understanding of the instructions.   The patient was advised to call back or seek an in-person evaluation if the symptoms worsen or if the condition fails to improve as anticipated.  I provided 17 minutes of non-face-to-face time during this encounter including median intraservice time, reviewing previous notes, labs, imaging, medications and explaining diagnosis and management.  Gildardo Pounds, FNP-BC

## 2019-08-04 DIAGNOSIS — K5100 Ulcerative (chronic) pancolitis without complications: Secondary | ICD-10-CM | POA: Diagnosis not present

## 2019-08-12 DIAGNOSIS — K5100 Ulcerative (chronic) pancolitis without complications: Secondary | ICD-10-CM | POA: Diagnosis not present

## 2019-08-26 DIAGNOSIS — S52532D Colles' fracture of left radius, subsequent encounter for closed fracture with routine healing: Secondary | ICD-10-CM | POA: Diagnosis not present

## 2019-09-08 DIAGNOSIS — Z79899 Other long term (current) drug therapy: Secondary | ICD-10-CM | POA: Insufficient documentation

## 2019-09-08 DIAGNOSIS — Z7960 Long term (current) use of unspecified immunomodulators and immunosuppressants: Secondary | ICD-10-CM | POA: Insufficient documentation

## 2019-09-09 DIAGNOSIS — K5100 Ulcerative (chronic) pancolitis without complications: Secondary | ICD-10-CM | POA: Diagnosis not present

## 2019-10-18 ENCOUNTER — Telehealth: Payer: Self-pay

## 2019-10-18 NOTE — Telephone Encounter (Signed)
Called patient to do their pre-visit COVID screening.  Call went to voicemail. Unable to do prescreening.  

## 2019-10-18 NOTE — Patient Instructions (Signed)

## 2019-10-19 ENCOUNTER — Other Ambulatory Visit: Payer: Self-pay

## 2019-10-19 ENCOUNTER — Ambulatory Visit (INDEPENDENT_AMBULATORY_CARE_PROVIDER_SITE_OTHER): Payer: BC Managed Care – PPO | Admitting: Internal Medicine

## 2019-10-19 ENCOUNTER — Encounter: Payer: Self-pay | Admitting: Internal Medicine

## 2019-10-19 VITALS — BP 136/92 | HR 75 | Temp 98.4°F | Resp 17 | Ht 69.0 in | Wt 207.0 lb

## 2019-10-19 DIAGNOSIS — Z0000 Encounter for general adult medical examination without abnormal findings: Secondary | ICD-10-CM

## 2019-10-19 DIAGNOSIS — D473 Essential (hemorrhagic) thrombocythemia: Secondary | ICD-10-CM

## 2019-10-19 DIAGNOSIS — I10 Essential (primary) hypertension: Secondary | ICD-10-CM

## 2019-10-19 DIAGNOSIS — Z113 Encounter for screening for infections with a predominantly sexual mode of transmission: Secondary | ICD-10-CM

## 2019-10-19 DIAGNOSIS — F32A Depression, unspecified: Secondary | ICD-10-CM

## 2019-10-19 DIAGNOSIS — F419 Anxiety disorder, unspecified: Secondary | ICD-10-CM

## 2019-10-19 DIAGNOSIS — K5180 Other ulcerative colitis without complications: Secondary | ICD-10-CM | POA: Diagnosis not present

## 2019-10-19 DIAGNOSIS — F329 Major depressive disorder, single episode, unspecified: Secondary | ICD-10-CM

## 2019-10-19 DIAGNOSIS — D75839 Thrombocytosis, unspecified: Secondary | ICD-10-CM

## 2019-10-19 NOTE — Progress Notes (Signed)
Subjective:    HALLIS MEDITZ - 44 y.o. male MRN [id number]  Date of birth: 1976/01/02  HPI  Charles Hardy is here for annual exam. Requests refills for his medications. Otherwise, no concerns. He was in rehab for alcohol abuse and had multiple labs checked during this stay. He presents with lab results. Noted to initially he had elevated LFTs and bilirubin; these normalized during stay. He had normal lipid panel. CBC showed mildly elevated platelets even on repeat labs. Hepatitis panel was negative.      Health Maintenance:  Health Maintenance Due  Topic Date Due  . COVID-19 Vaccine (1) Never done  . TETANUS/TDAP  Never done    -  reports that he has never smoked. He has never used smokeless tobacco. - Review of Systems: Per HPI. - Past Medical History: Patient Active Problem List   Diagnosis Date Noted  . Long-term use of immunosuppressant medication 09/08/2019  . Pneumobilia 04/03/2019  . Alcohol abuse 04/03/2019  . Atrial fibrillation with RVR ([place]) 04/03/2019  . Iron deficiency anemia   . Moderate protein-calorie malnutrition ([place])   . Alcohol withdrawal ([place]) 02/10/2019  . Ulcerative colitis ([place]) 02/10/2019   - Medications: reviewed and updated   Objective:   Physical Exam BP (!) 136/92   Pulse 75   Temp 98.4 F (36.9 C) (Temporal)   Resp 17   Ht 5' 9"  (1.753 m)   Wt 207 lb (93.9 kg)   SpO2 98%   BMI 30.57 kg/m  Physical Exam Constitutional:      Appearance: He is not diaphoretic.  HENT:     Head: Normocephalic and atraumatic.  Eyes:     Conjunctiva/sclera: Conjunctivae normal.     Pupils: Pupils are equal, round, and reactive to light.  Neck:     Thyroid: No thyromegaly.  Cardiovascular:     Rate and Rhythm: Normal rate and regular rhythm.     Heart sounds: Normal heart sounds. No murmur heard.   Pulmonary:     Effort: Pulmonary effort is normal. No respiratory distress.     Breath sounds: Normal breath sounds. No wheezing.  Abdominal:      General: Bowel sounds are normal. There is no distension.     Palpations: Abdomen is soft.     Tenderness: There is no abdominal tenderness. There is no guarding or rebound.  Musculoskeletal:        General: No deformity. Normal range of motion.     Cervical back: Normal range of motion and neck supple.  Lymphadenopathy:     Cervical: No cervical adenopathy.  Skin:    General: Skin is warm and dry.     Findings: No rash.  Neurological:     Mental Status: He is alert and oriented to person, place, and time.     Gait: Gait is intact.  Psychiatric:        Mood and Affect: Mood and affect normal.        Judgment: Judgment normal.            Assessment & Plan:   1. Annual physical exam Counseled on 150 minutes of exercise per week, healthy eating (including decreased daily intake of saturated fats, cholesterol, added sugars, sodium), STI prevention, routine healthcare maintenance. - CBC with Differential - Comprehensive metabolic panel - Lipid Panel  2. Screening for STDs (sexually transmitted diseases) - GC/Chlamydia Probe Amp([facility])  3. Need for hepatitis C screening test - Hepatitis C Antibody  4. Other ulcerative colitis  without complication (HCC) - omeprazole (PRILOSEC) 40 MG capsule; Take 1 capsule (40 mg total) by mouth daily.  Dispense: 90 capsule; Refill: 0  5. Essential hypertension - propranolol (INDERAL) 10 MG tablet; Take 1 tablet (10 mg total) by mouth 2 (two) times daily.  Dispense: 270 tablet; Refill: 0  6. Anxiety and depression - traZODone (DESYREL) 50 MG tablet; Take 1 tablet (50 mg total) by mouth at bedtime as needed.  Dispense: 90 tablet; Refill: 0  8. Thrombocytosis ([place]) Suspect this may have been related to rebound thrombocytosis after discontinuation of alcohol abuse. Will repeat to ensure has normalized, was downtrending prior to discharge.  - CBC with Differential     Phill Myron, D.O. 10/22/2019, 11:25 AM Primary Care at [facility]

## 2019-10-20 ENCOUNTER — Ambulatory Visit: Payer: BC Managed Care – PPO | Admitting: Internal Medicine

## 2019-10-20 LAB — CBC WITH DIFFERENTIAL/PLATELET
Basophils Absolute: 0 10*3/uL (ref 0.0–0.2)
Basos: 0 %
EOS (ABSOLUTE): 0 10*3/uL (ref 0.0–0.4)
Eos: 0 %
Hematocrit: 42.6 % (ref 37.5–51.0)
Hemoglobin: 14.2 g/dL (ref 13.0–17.7)
Immature Grans (Abs): 0 10*3/uL (ref 0.0–0.1)
Immature Granulocytes: 0 %
Lymphocytes Absolute: 0.8 10*3/uL (ref 0.7–3.1)
Lymphs: 9 %
MCH: 27.4 pg (ref 26.6–33.0)
MCHC: 33.3 g/dL (ref 31.5–35.7)
MCV: 82 fL (ref 79–97)
Monocytes Absolute: 0.3 10*3/uL (ref 0.1–0.9)
Monocytes: 3 %
Neutrophils Absolute: 8 10*3/uL — ABNORMAL HIGH (ref 1.4–7.0)
Neutrophils: 88 %
Platelets: 388 10*3/uL (ref 150–450)
RBC: 5.19 x10E6/uL (ref 4.14–5.80)
RDW: 13 % (ref 11.6–15.4)
WBC: 9.2 10*3/uL (ref 3.4–10.8)

## 2019-10-20 NOTE — Progress Notes (Signed)
Patient notified of results & recommendations. Expressed understanding.

## 2019-10-21 DIAGNOSIS — K5100 Ulcerative (chronic) pancolitis without complications: Secondary | ICD-10-CM | POA: Diagnosis not present

## 2019-10-22 MED ORDER — TRAZODONE HCL 50 MG PO TABS: 50.0000 mg | ORAL_TABLET | Freq: Every evening | ORAL | 0 refills | Status: DC | PRN

## 2019-10-22 MED ORDER — PROPRANOLOL HCL 10 MG PO TABS: 10.0000 mg | ORAL_TABLET | Freq: Two times a day (BID) | ORAL | 0 refills | Status: DC

## 2019-10-22 MED ORDER — OMEPRAZOLE 40 MG PO CPDR: 40.0000 mg | DELAYED_RELEASE_CAPSULE | Freq: Every day | ORAL | 0 refills | Status: DC

## 2019-10-22 MED ORDER — VENLAFAXINE HCL ER 37.5 MG PO CP24: ORAL_CAPSULE | ORAL | 0 refills | Status: DC

## 2019-10-27 ENCOUNTER — Other Ambulatory Visit: Payer: Self-pay

## 2019-10-27 DIAGNOSIS — I10 Essential (primary) hypertension: Secondary | ICD-10-CM

## 2019-10-27 MED ORDER — PROPRANOLOL HCL 10 MG PO TABS: 10.0000 mg | ORAL_TABLET | Freq: Two times a day (BID) | ORAL | 0 refills | Status: DC

## 2019-10-28 DIAGNOSIS — Z91030 Bee allergy status: Secondary | ICD-10-CM

## 2019-10-29 DIAGNOSIS — I10 Essential (primary) hypertension: Secondary | ICD-10-CM

## 2019-10-29 MED ORDER — EPINEPHRINE 0.3 MG/0.3ML IJ SOAJ: 0.3000 mg | INTRAMUSCULAR | 0 refills | Status: AC | PRN

## 2019-11-02 MED ORDER — PROPRANOLOL HCL 10 MG PO TABS: 10.0000 mg | ORAL_TABLET | Freq: Three times a day (TID) | ORAL | 0 refills | Status: DC

## 2019-12-06 DIAGNOSIS — K5100 Ulcerative (chronic) pancolitis without complications: Secondary | ICD-10-CM | POA: Diagnosis not present

## 2019-12-06 DIAGNOSIS — K852 Alcohol induced acute pancreatitis without necrosis or infection: Secondary | ICD-10-CM | POA: Diagnosis not present

## 2019-12-06 DIAGNOSIS — I10 Essential (primary) hypertension: Secondary | ICD-10-CM | POA: Diagnosis not present

## 2019-12-06 DIAGNOSIS — K219 Gastro-esophageal reflux disease without esophagitis: Secondary | ICD-10-CM | POA: Diagnosis not present

## 2019-12-10 DIAGNOSIS — K5100 Ulcerative (chronic) pancolitis without complications: Secondary | ICD-10-CM | POA: Diagnosis not present

## 2019-12-24 DIAGNOSIS — K5100 Ulcerative (chronic) pancolitis without complications: Secondary | ICD-10-CM | POA: Diagnosis not present

## 2020-01-18 ENCOUNTER — Other Ambulatory Visit: Payer: Self-pay

## 2020-01-18 DIAGNOSIS — F32A Depression, unspecified: Secondary | ICD-10-CM

## 2020-01-18 DIAGNOSIS — I10 Essential (primary) hypertension: Secondary | ICD-10-CM

## 2020-01-18 DIAGNOSIS — F419 Anxiety disorder, unspecified: Secondary | ICD-10-CM

## 2020-01-18 DIAGNOSIS — K5180 Other ulcerative colitis without complications: Secondary | ICD-10-CM

## 2020-01-18 MED ORDER — PROPRANOLOL HCL 10 MG PO TABS: 10.0000 mg | ORAL_TABLET | Freq: Three times a day (TID) | ORAL | 0 refills | Status: DC

## 2020-01-18 MED ORDER — VENLAFAXINE HCL ER 37.5 MG PO CP24: ORAL_CAPSULE | ORAL | 0 refills | Status: DC

## 2020-01-18 MED ORDER — TRAZODONE HCL 50 MG PO TABS: 50.0000 mg | ORAL_TABLET | Freq: Every evening | ORAL | 0 refills | Status: DC | PRN

## 2020-01-18 MED ORDER — OMEPRAZOLE 40 MG PO CPDR: 40.0000 mg | DELAYED_RELEASE_CAPSULE | Freq: Every day | ORAL | 0 refills | Status: DC

## 2020-01-21 ENCOUNTER — Other Ambulatory Visit: Payer: Self-pay

## 2020-01-21 DIAGNOSIS — K5100 Ulcerative (chronic) pancolitis without complications: Secondary | ICD-10-CM | POA: Diagnosis not present

## 2020-02-10 DIAGNOSIS — K5100 Ulcerative (chronic) pancolitis without complications: Secondary | ICD-10-CM | POA: Diagnosis not present

## 2020-03-17 DIAGNOSIS — K5100 Ulcerative (chronic) pancolitis without complications: Secondary | ICD-10-CM | POA: Diagnosis not present

## 2020-04-14 ENCOUNTER — Other Ambulatory Visit: Payer: Self-pay | Admitting: Podiatry

## 2020-04-14 DIAGNOSIS — S8411XA Injury of peroneal nerve at lower leg level, right leg, initial encounter: Secondary | ICD-10-CM

## 2020-04-14 DIAGNOSIS — M21371 Foot drop, right foot: Secondary | ICD-10-CM

## 2020-04-16 ENCOUNTER — Other Ambulatory Visit: Payer: Self-pay | Admitting: Internal Medicine

## 2020-04-16 DIAGNOSIS — F419 Anxiety disorder, unspecified: Secondary | ICD-10-CM

## 2020-05-15 ENCOUNTER — Other Ambulatory Visit: Payer: Self-pay

## 2020-05-15 DIAGNOSIS — I10 Essential (primary) hypertension: Secondary | ICD-10-CM

## 2020-05-15 MED ORDER — PROPRANOLOL HCL 10 MG PO TABS
10.0000 mg | ORAL_TABLET | Freq: Three times a day (TID) | ORAL | 0 refills | Status: AC
Start: 2020-05-15 — End: ?

## 2020-05-18 ENCOUNTER — Other Ambulatory Visit: Payer: Self-pay | Admitting: Internal Medicine

## 2020-05-18 DIAGNOSIS — F419 Anxiety disorder, unspecified: Secondary | ICD-10-CM

## 2020-05-18 DIAGNOSIS — F32A Depression, unspecified: Secondary | ICD-10-CM

## 2020-05-26 ENCOUNTER — Other Ambulatory Visit: Payer: Self-pay

## 2020-05-26 DIAGNOSIS — F419 Anxiety disorder, unspecified: Secondary | ICD-10-CM

## 2020-05-26 DIAGNOSIS — K5180 Other ulcerative colitis without complications: Secondary | ICD-10-CM

## 2020-05-26 NOTE — Telephone Encounter (Signed)
Pt not seen since 10-19-19, pls send refills if appropriate

## 2020-05-29 MED ORDER — OMEPRAZOLE 40 MG PO CPDR
40.0000 mg | DELAYED_RELEASE_CAPSULE | Freq: Every day | ORAL | 0 refills | Status: DC
Start: 2020-05-29 — End: 2020-09-21

## 2020-05-29 MED ORDER — VENLAFAXINE HCL ER 37.5 MG PO CP24: ORAL_CAPSULE | ORAL | 0 refills | Status: AC

## 2020-09-19 ENCOUNTER — Other Ambulatory Visit: Payer: Self-pay | Admitting: Internal Medicine

## 2020-09-19 DIAGNOSIS — K5180 Other ulcerative colitis without complications: Secondary | ICD-10-CM

## 2020-09-21 ENCOUNTER — Other Ambulatory Visit: Payer: Self-pay

## 2020-09-21 ENCOUNTER — Other Ambulatory Visit: Payer: Self-pay | Admitting: Internal Medicine

## 2020-09-21 DIAGNOSIS — K5180 Other ulcerative colitis without complications: Secondary | ICD-10-CM

## 2020-09-21 MED ORDER — OMEPRAZOLE 40 MG PO CPDR: 40.0000 mg | DELAYED_RELEASE_CAPSULE | Freq: Every day | ORAL | 0 refills | Status: AC

## 2020-10-14 ENCOUNTER — Other Ambulatory Visit: Payer: Self-pay | Admitting: Internal Medicine

## 2020-10-14 DIAGNOSIS — I10 Essential (primary) hypertension: Secondary | ICD-10-CM

## 2020-11-09 IMAGING — CT CT ABD-PELV W/ CM
2 of 5 series · 16 of 46 positions shown, 18 images · IV contrast (APPLIED)
Comparison: None.

CLINICAL DATA: Abdominal pain, history of ulcerative colitis

EXAM:
CT ABDOMEN AND PELVIS WITH CONTRAST
TECHNIQUE: Multidetector CT imaging of the abdomen and pelvis was performed
using the standard protocol following bolus administration of
intravenous contrast.
CONTRAST:  100mL OMNIPAQUE IOHEXOL 300 MG/ML  SOLN

[Series 3: abdomen 5.0 · axial · 0.91mm/px · z∈[+873,+1323]mm · 13 of 104 slices shown, 15 images]
[im 7/104  soft-tissue]
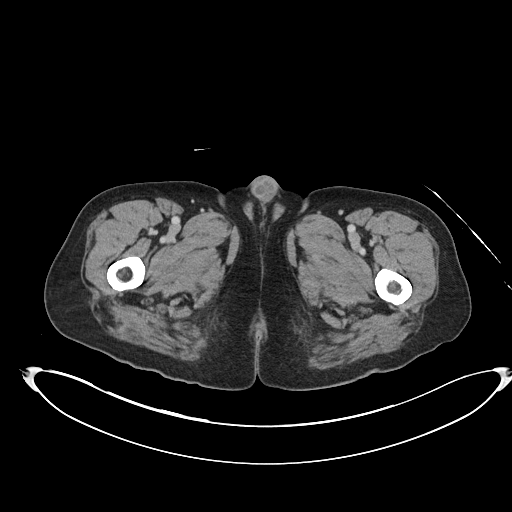
[im 7/104  bone]
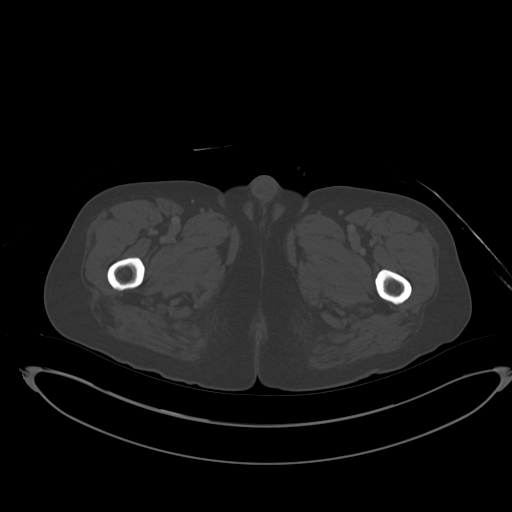
[im 14/104  soft-tissue]
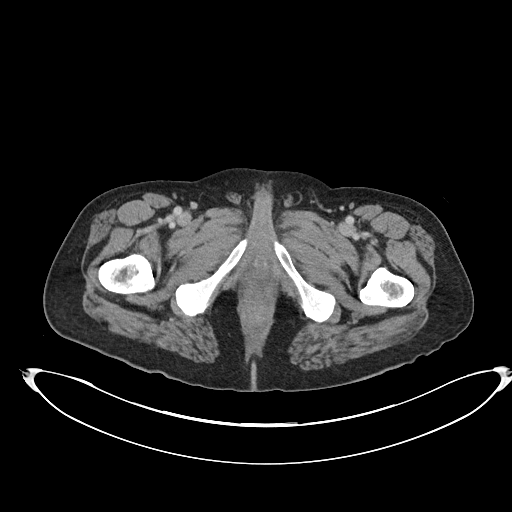
[im 21/104  soft-tissue]
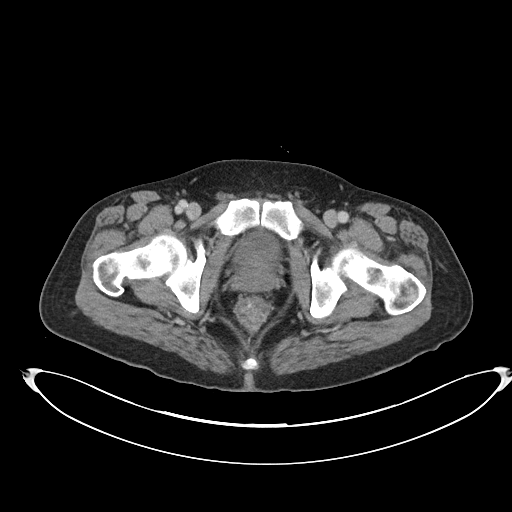
[im 28/104  soft-tissue]
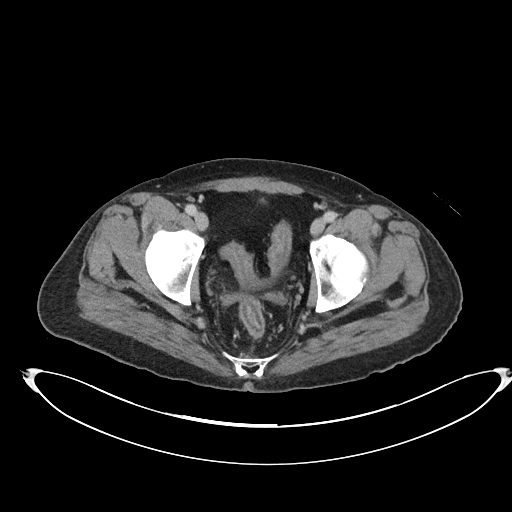
[im 35/104  soft-tissue]
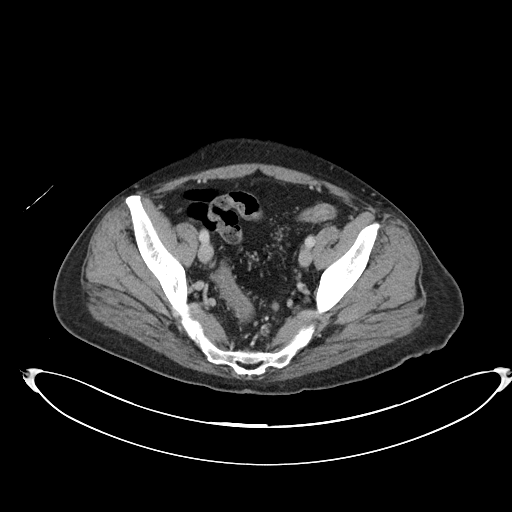
[im 42/104  soft-tissue]
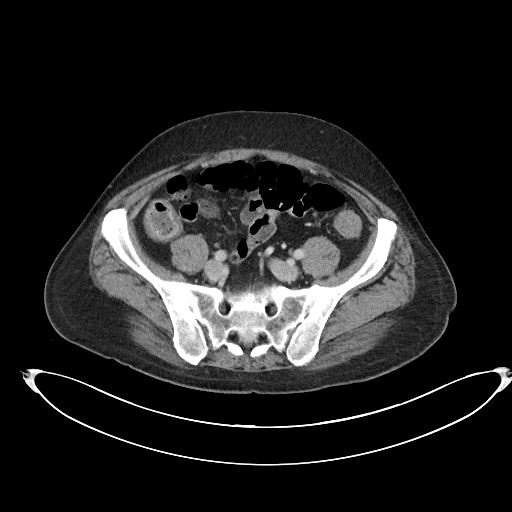
[im 55/104  soft-tissue]
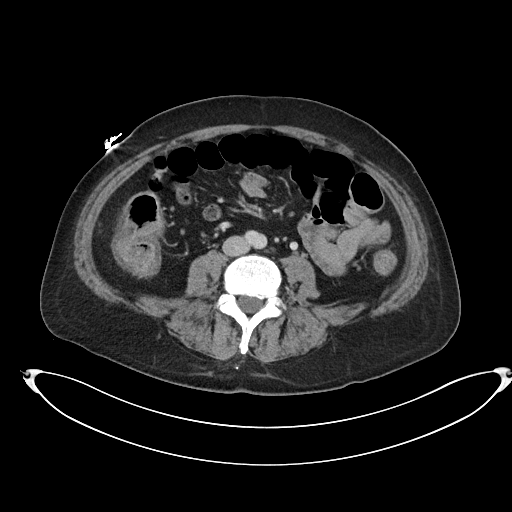
[im 62/104  soft-tissue]
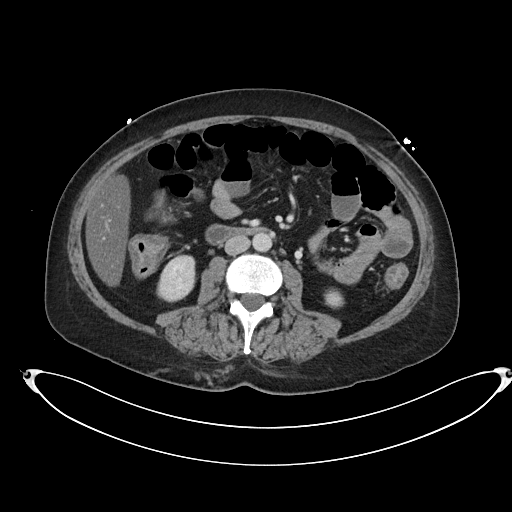
[im 69/104  soft-tissue]
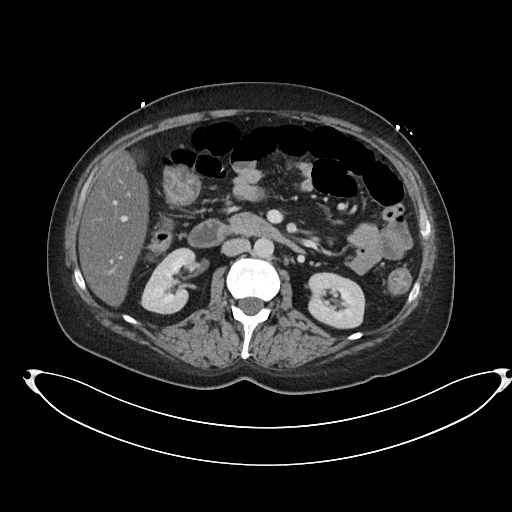
[im 69/104  bone]
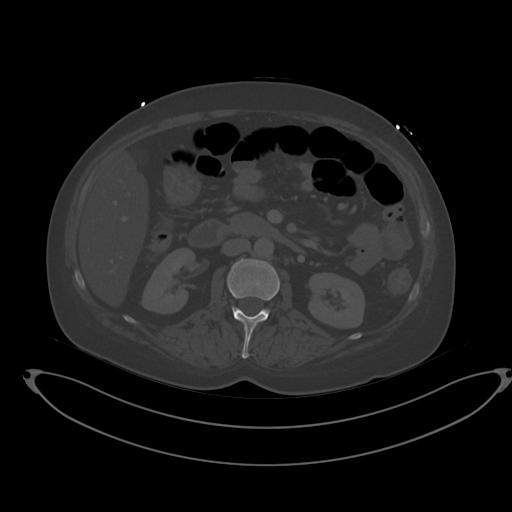
[im 76/104  soft-tissue]
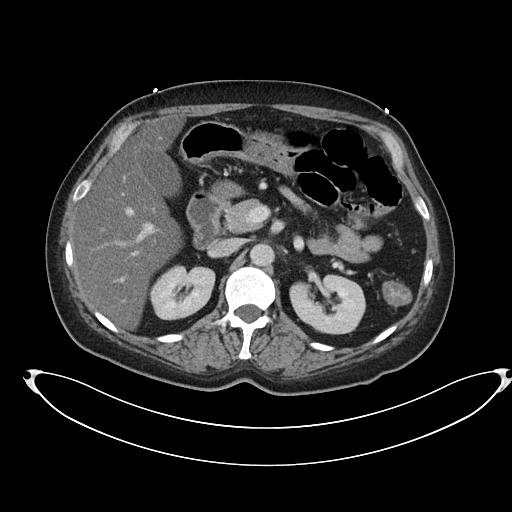
[im 83/104  soft-tissue]
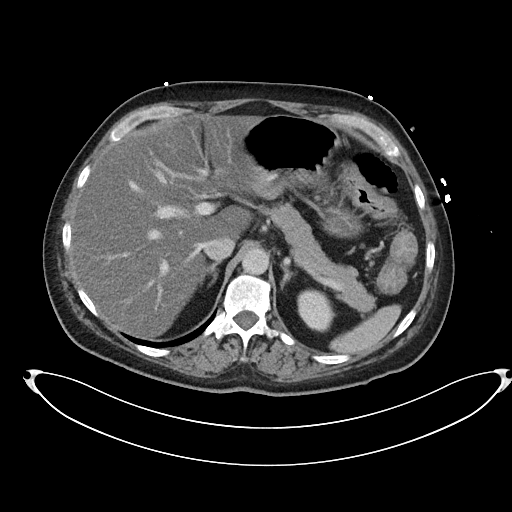
[im 90/104  soft-tissue]
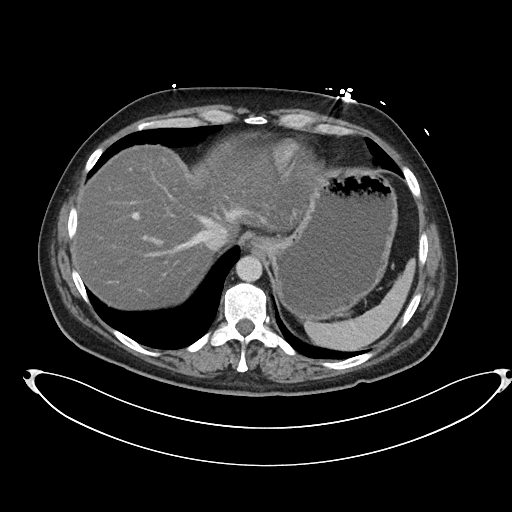
[im 97/104  soft-tissue]
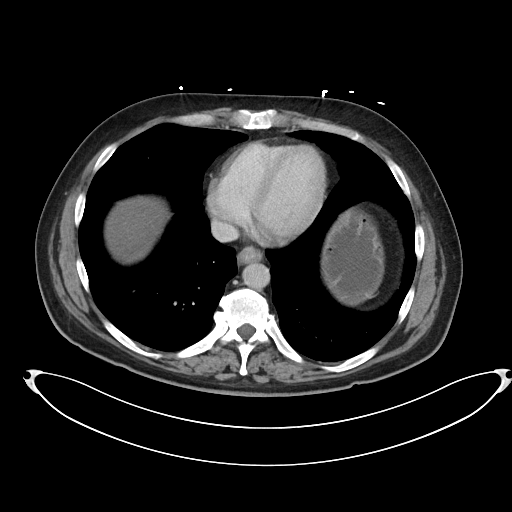

[Series 6: abdomen 3.0 mpr cor · coronal · 0.88mm/px · 3 of 111 slices shown]
[im 37/111  soft-tissue]
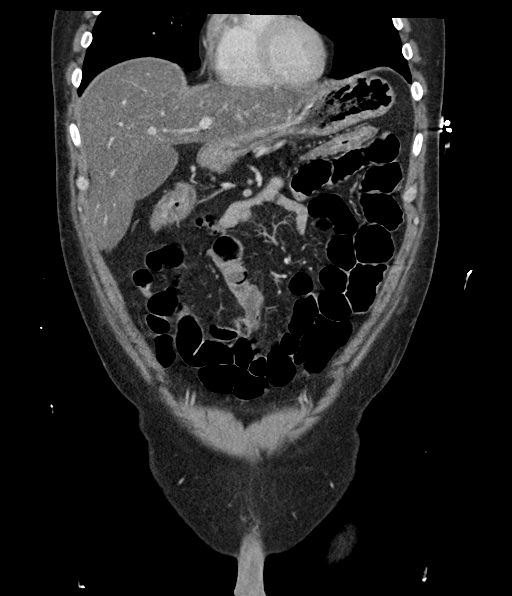
[im 49/111  soft-tissue]
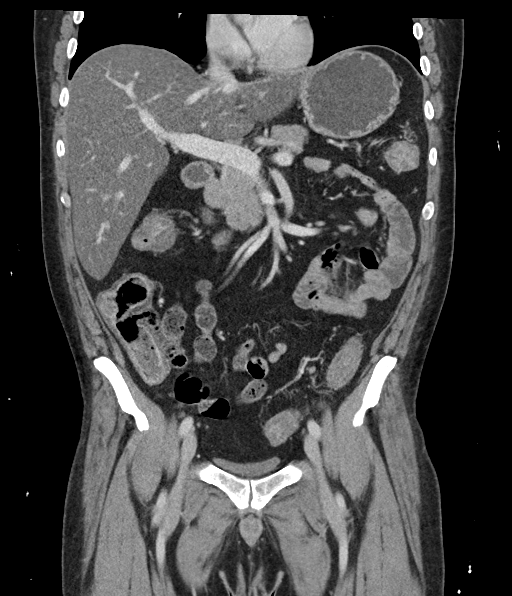
[im 62/111  soft-tissue]
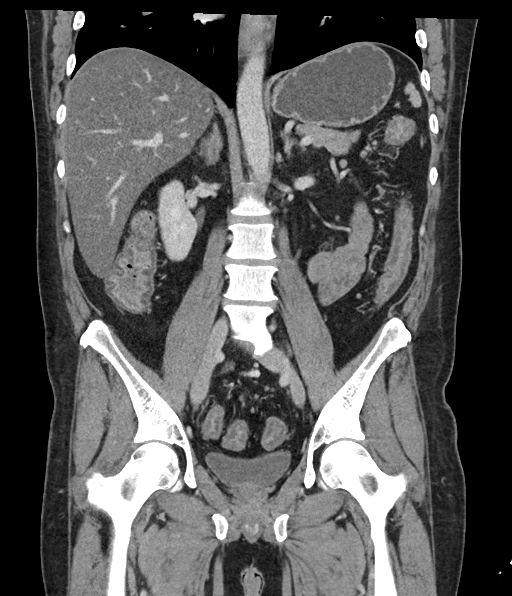

[16 of 46 positions shown; findings below may reference images not displayed]

FINDINGS: Lower chest: The visualized heart size within normal limits. No
pericardial fluid/thickening.

No hiatal hernia.

The visualized portions of the lungs are clear.

Hepatobiliary: There is diffuse low density seen throughout the
liver parenchyma.The main portal vein is patent. No evidence of
calcified gallstones, gallbladder wall thickening or biliary
dilatation.

Pancreas: Unremarkable. No pancreatic ductal dilatation or
surrounding inflammatory changes.

Spleen: Normal in size without focal abnormality.

Adrenals/Urinary Tract: Both adrenal glands appear normal. The
kidneys and collecting system appear normal without evidence of
urinary tract calculus or hydronephrosis. Bladder is unremarkable.

Stomach/Bowel: The stomach and small bowel are normal in appearance.
There is diffuse bowel wall thickening seen throughout the entirety
of the colon with bowel wall edema and hyperenhancement. There is
slight mesenteric hyperemia seen around the sigmoid colon. There is
a focal area of narrowing seen within the transverse colon best seen
on series 6, image 27.

Vascular/Lymphatic: There are no enlarged mesenteric,
retroperitoneal, or pelvic lymph nodes. No significant vascular
findings are present.

Reproductive: The prostate is unremarkable.

Other: No evidence of abdominal wall mass or hernia.

Musculoskeletal: No acute or significant osseous findings.
IMPRESSION: 1. Diffuse bowel wall thickening, hyperenhancement, and edema
involving the entirety of the colon, consistent with the patient's
history of ulcerative colitis. There is a probable focal area of
narrowing seen in the transverse colon.
2. No evidence of bowel obstruction.
3. Hepatic steatosis.

## 2023-11-11 ENCOUNTER — Ambulatory Visit: Admission: EM | Admit: 2023-11-11 | Discharge: 2023-11-11 | Disposition: A | Discharge status: Home / Self Care

## 2023-11-11 ENCOUNTER — Telehealth: Payer: Self-pay | Admitting: Emergency Medicine

## 2023-11-11 ENCOUNTER — Encounter: Payer: Self-pay | Admitting: Emergency Medicine

## 2023-11-11 DIAGNOSIS — S21232A Puncture wound without foreign body of left back wall of thorax without penetration into thoracic cavity, initial encounter: Secondary | ICD-10-CM | POA: Diagnosis not present

## 2023-11-11 DIAGNOSIS — T63451A Toxic effect of venom of hornets, accidental (unintentional), initial encounter: Secondary | ICD-10-CM | POA: Diagnosis not present

## 2023-11-11 MED ORDER — PREDNISONE 10 MG (21) PO TBPK: ORAL_TABLET | Freq: Every day | ORAL | 0 refills | Status: AC

## 2023-11-11 NOTE — ED Provider Notes (Signed)
 [facility]    CSN: [id number] Arrival date & time: 11/11/23  1413      History   Chief Complaint Chief Complaint  Patient presents with   Allergic Reaction    HPI Charles Hardy is a 48 y.o. male.   Patient presents for evaluation of a hornet sting to the left side of the back that occurred within the hour.  Side is tender but denies swelling or drainage.  Has taken Benadryl .  Denies all respiratory symptoms.  Has had anaphylactic reaction prior, has EpiPen  available.    Past Medical History:  Diagnosis Date   Allergy    Anxiety    Colitis    Ulcerative colitis [facility])     Patient Active Problem List   Diagnosis Date Noted   Long-term use of immunosuppressant medication 09/08/2019   Pneumobilia 04/03/2019   Alcohol abuse 04/03/2019   Atrial fibrillation with RVR (HCC) 04/03/2019   Iron deficiency anemia    Moderate protein-calorie malnutrition (HCC)    Alcohol withdrawal (HCC) 02/10/2019   Ulcerative colitis (HCC) 02/10/2019    History reviewed. No pertinent surgical history.     Home Medications    Prior to Admission medications   Medication Sig Start Date End Date Taking? Authorizing Provider  diphenhydrAMINE  (BENADRYL ) 25 mg capsule Take 25 mg by mouth every 6 (six) hours as needed.   Yes [provider]  Adalimumab  (HUMIRA ) 40 MG/0.4ML PSKT Inject 40 mg into the skin once a week.     [provider]  EPINEPHrine  0.3 mg/0.3 mL IJ SOAJ injection Inject 0.3 mLs (0.3 mg total) into the muscle as needed for anaphylaxis. 10/29/19   Prentiss Dorothyann Maxwell, MD  Multiple Vitamin (MULTIVITAMIN) capsule Take 1 capsule by mouth daily.    [provider]  omeprazole  (PRILOSEC) 40 MG capsule Take 1 capsule (40 mg total) by mouth daily. 09/21/20   Prentiss Dorothyann Maxwell, MD  predniSONE  (DELTASONE ) 10 MG tablet 40mg  po qd for 7 days, 30mg  po qd for 7 days, 20mg  po qd x 7days, 15mg  po qd x 7days, 10mg  po qd x 7 days, 5mg  po qd x  7days 09/30/19   [provider]  propranolol  (INDERAL ) 10 MG tablet Take 1 tablet (10 mg total) by mouth 3 (three) times daily. 05/15/20   Prentiss Dorothyann Maxwell, MD  traZODone  (DESYREL ) 50 MG tablet TAKE 1 TABLET (50 MG TOTAL) BY MOUTH AT BEDTIME AS NEEDED. 04/18/20   Prentiss Dorothyann Maxwell, MD  venlafaxine  XR (EFFEXOR -XR) 37.5 MG 24 hr capsule TAKE 1 CAPSULE BY MOUTH DAILY WITH BREAKFAST. 05/29/20   Prentiss Dorothyann Maxwell, MD    Family History Family History  Problem Relation Age of Onset   Diabetes Mother    Hypertension Mother    Diabetes Maternal Grandfather    Hyperlipidemia Maternal Grandfather    Hypertension Maternal Grandfather     Social History Social History   Tobacco Use   Smoking status: Never    Passive exposure: Never   Smokeless tobacco: Never  Vaping Use   Vaping status: Never Used  Substance Use Topics   Alcohol use: Yes    Comment: Vodka everyday.   Drug use: Never     Allergies   Bee venom   Review of Systems Review of Systems   Physical Exam Triage Vital Signs ED Triage Vitals  Encounter Vitals Group     BP 11/11/23 1424 (!) 143/98     Girls Systolic BP Percentile --  Girls Diastolic BP Percentile --      Boys Systolic BP Percentile --      Boys Diastolic BP Percentile --      Pulse Rate 11/11/23 1424 72     Resp 11/11/23 1424 18     Temp 11/11/23 1424 98.8 F (37.1 C)     Temp Source 11/11/23 1424 Oral     SpO2 11/11/23 1424 99 %     Weight 11/11/23 1423 205 lb (93 kg)     Height --      Head Circumference --      Peak Flow --      Pain Score 11/11/23 1422 0     Pain Loc --      Pain Education --      Exclude from Growth Chart --    No data found.  Updated Vital Signs BP (!) 143/98 (BP Location: Left Arm)   Pulse 72   Temp 98.8 F (37.1 C) (Oral)   Resp 18   Wt 205 lb (93 kg)   SpO2 99%   BMI 30.27 kg/m   Visual Acuity Right Eye Distance:   Left Eye Distance:   Bilateral Distance:    Right  Eye Near:   Left Eye Near:    Bilateral Near:     Physical Exam Constitutional:      Appearance: Normal appearance.  HENT:     Mouth/Throat:     Mouth: Mucous membranes are moist.     Pharynx: Oropharynx is clear.  Eyes:     Extraocular Movements: Extraocular movements intact.  Cardiovascular:     Rate and Rhythm: Normal rate and regular rhythm.     Pulses: Normal pulses.     Heart sounds: Normal heart sounds.  Pulmonary:     Effort: Pulmonary effort is normal.     Breath sounds: Normal breath sounds.  Skin:    Comments: Less than 0.5 cm puncture wound present at the thoracic region and the back, no ecchymosis or swelling noted  Neurological:     Mental Status: He is alert and oriented to person, place, and time. Mental status is at baseline.      UC Treatments / Results  Labs (all labs ordered are listed, but only abnormal results are displayed) Labs Reviewed - No data to display  EKG   Radiology No results found.  Procedures Procedures (including critical care time)  Medications Ordered in UC Medications - No data to display  Initial Impression / Assessment and Plan / UC Course  I have reviewed the triage vital signs and the nursing notes.  Pertinent labs & imaging results that were available during my care of the patient were reviewed by me and considered in my medical decision making (see chart for details).  Puncture wound to left side of back, hornet sting accidental and unintentional  No abnormality to the skin, no respiratory involvement, vitals are stable, patient in no signs distress nontoxic-appearing, prescribed prednisone , declined IM injection, given precautions for any respiratory involvement to inject EpiPen  and go to the nearest emergency department, may follow-up with urgent care as needed Final Clinical Impressions(s) / UC Diagnoses   Final diagnoses:  None   Discharge Instructions   None    ED Prescriptions   None    PDMP not  reviewed this encounter.   Teresa Shelba SAUN[place], [place] 11/11/23 [phone]

## 2023-11-11 NOTE — ED Triage Notes (Signed)
 Pt presents c/o allergic reaction. Pt was stung by a hornet while at the pool today. 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 Pt took 2 Benadryl  25mg  tabs previous to arrival.

## 2023-11-11 NOTE — ED Notes (Signed)
 Pt presents c/o allergic reaction. Pt was stung by a hornet while at the pool today. 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 Pt took 2 Benadryl  25mg  tabs previous to arrival.

## 2023-11-11 NOTE — Telephone Encounter (Signed)
 Spoke with pt about directions on how to take the meds prescribed to him today. Pt asked does he need to take the pills all at once or spread them out throughout the day. Pt was advised to start the meds in the morning and to take all 6 pills for day 1 at one time per Adrienne White. Then, follow package instructions for the remaining medication. Pt verbalized understanding. No further action required.

## 2023-11-11 NOTE — Discharge Instructions (Addendum)
 Your evaluated for a hornet sting, on the back there are no abnormalities  Due to your history will initiate oral prednisone  to keep you from having a late reaction, stop the inflammatory reaction process  Begin prednisone  every morning with food for 6 days  May apply ice or heat over the affected area 10 to 15-minute intervals for general comfort  At any point if you begin to have shortness of breath, chest pain or tightness, throat tightness or difficulty swallowing please administer EpiPen  and go to the nearest emergency department for immediate evaluation
